# Patient Record
Sex: Female | Born: 1945
Health system: Southern US, Community
[De-identification: ages and names within clinical notes are randomized; demographics above are authoritative.]

## PROBLEM LIST (undated history)

## (undated) DIAGNOSIS — N951 Menopausal and female climacteric states: Secondary | ICD-10-CM

## (undated) DIAGNOSIS — I700 Atherosclerosis of aorta: Secondary | ICD-10-CM

## (undated) DIAGNOSIS — E890 Postprocedural hypothyroidism: Secondary | ICD-10-CM

## (undated) DIAGNOSIS — E785 Hyperlipidemia, unspecified: Secondary | ICD-10-CM

## (undated) DIAGNOSIS — N189 Chronic kidney disease, unspecified: Secondary | ICD-10-CM

## (undated) DIAGNOSIS — E7800 Pure hypercholesterolemia, unspecified: Secondary | ICD-10-CM

## (undated) DIAGNOSIS — E039 Hypothyroidism, unspecified: Secondary | ICD-10-CM

## (undated) DIAGNOSIS — I10 Essential (primary) hypertension: Secondary | ICD-10-CM

## (undated) DIAGNOSIS — Z905 Acquired absence of kidney: Secondary | ICD-10-CM

## (undated) DIAGNOSIS — K219 Gastro-esophageal reflux disease without esophagitis: Secondary | ICD-10-CM

## (undated) DIAGNOSIS — L570 Actinic keratosis: Secondary | ICD-10-CM

## (undated) DIAGNOSIS — M722 Plantar fascial fibromatosis: Secondary | ICD-10-CM

## (undated) DIAGNOSIS — F5101 Primary insomnia: Secondary | ICD-10-CM

## (undated) DIAGNOSIS — K635 Polyp of colon: Secondary | ICD-10-CM

## (undated) DIAGNOSIS — C44219 Basal cell carcinoma of skin of left ear and external auricular canal: Secondary | ICD-10-CM

## (undated) DIAGNOSIS — E119 Type 2 diabetes mellitus without complications: Secondary | ICD-10-CM

## (undated) DIAGNOSIS — I509 Heart failure, unspecified: Secondary | ICD-10-CM

## (undated) DIAGNOSIS — Z8673 Personal history of transient ischemic attack (TIA), and cerebral infarction without residual deficits: Secondary | ICD-10-CM

## (undated) DIAGNOSIS — Z9889 Other specified postprocedural states: Secondary | ICD-10-CM

## (undated) DIAGNOSIS — G459 Transient cerebral ischemic attack, unspecified: Secondary | ICD-10-CM

## (undated) DIAGNOSIS — I69398 Other sequelae of cerebral infarction: Secondary | ICD-10-CM

## (undated) DIAGNOSIS — R600 Localized edema: Secondary | ICD-10-CM

## (undated) DIAGNOSIS — E079 Disorder of thyroid, unspecified: Secondary | ICD-10-CM

## (undated) DIAGNOSIS — C801 Malignant (primary) neoplasm, unspecified: Secondary | ICD-10-CM

## (undated) HISTORY — DX: Hypothyroidism, unspecified: E03.9

## (undated) HISTORY — DX: Polyp of colon: K63.5

## (undated) HISTORY — PX: ABDOMINAL HYSTERECTOMY: SHX81

## (undated) HISTORY — DX: Atherosclerosis of aorta: I70.0

## (undated) HISTORY — PX: KIDNEY SURGERY: SHX687

## (undated) HISTORY — DX: Actinic keratosis: L57.0

## (undated) HISTORY — DX: Gastro-esophageal reflux disease without esophagitis: K21.9

## (undated) HISTORY — DX: Chronic kidney disease, unspecified: N18.9

## (undated) HISTORY — DX: Malignant (primary) neoplasm, unspecified: C80.1

## (undated) HISTORY — PX: CHOLECYSTECTOMY: SHX55

## (undated) HISTORY — DX: Menopausal and female climacteric states: N95.1

## (undated) HISTORY — DX: Acquired absence of kidney: Z90.5

## (undated) HISTORY — DX: Other specified postprocedural states: Z98.890

## (undated) HISTORY — DX: Disorder of thyroid, unspecified: E07.9

## (undated) HISTORY — DX: Essential (primary) hypertension: I10

## (undated) HISTORY — DX: Primary insomnia: F51.01

## (undated) HISTORY — DX: Localized edema: R60.0

## (undated) HISTORY — DX: Pure hypercholesterolemia, unspecified: E78.00

## (undated) HISTORY — DX: Type 2 diabetes mellitus without complications: E11.9

## (undated) HISTORY — DX: Postprocedural hypothyroidism: E89.0

## (undated) HISTORY — DX: Plantar fascial fibromatosis: M72.2

## (undated) HISTORY — PX: NECK SURGERY: SHX720

## (undated) HISTORY — PX: CATARACT EXTRACTION: SUR2

## (undated) HISTORY — DX: Personal history of transient ischemic attack (TIA), and cerebral infarction without residual deficits: Z86.73

## (undated) HISTORY — DX: Heart failure, unspecified: I50.9

## (undated) HISTORY — DX: Basal cell carcinoma of skin of left ear and external auricular canal: C44.219

## (undated) HISTORY — DX: Other sequelae of cerebral infarction: I69.398

## (undated) HISTORY — DX: Hyperlipidemia, unspecified: E78.5

## (undated) HISTORY — DX: Transient cerebral ischemic attack, unspecified: G45.9

## (undated) HISTORY — PX: ELBOW SURGERY: SHX618

---

## 1998-04-22 ENCOUNTER — Emergency Department (HOSPITAL_COMMUNITY): Admission: EM | Admit: 1998-04-22 | Discharge: 1998-04-22 | Payer: Self-pay | Admitting: Emergency Medicine

## 1998-05-08 ENCOUNTER — Ambulatory Visit (HOSPITAL_COMMUNITY): Admission: RE | Admit: 1998-05-08 | Discharge: 1998-05-08 | Payer: Self-pay | Admitting: Gastroenterology

## 1999-06-27 ENCOUNTER — Inpatient Hospital Stay (HOSPITAL_COMMUNITY): Admission: EM | Admit: 1999-06-27 | Discharge: 1999-07-02 | Payer: Self-pay | Admitting: Emergency Medicine

## 1999-06-27 ENCOUNTER — Encounter: Payer: Self-pay | Admitting: Emergency Medicine

## 1999-06-28 ENCOUNTER — Encounter: Payer: Self-pay | Admitting: Gastroenterology

## 1999-06-29 ENCOUNTER — Encounter: Payer: Self-pay | Admitting: Gastroenterology

## 1999-09-19 ENCOUNTER — Emergency Department (HOSPITAL_COMMUNITY): Admission: EM | Admit: 1999-09-19 | Discharge: 1999-09-19 | Payer: Self-pay | Admitting: Emergency Medicine

## 1999-09-20 ENCOUNTER — Encounter: Payer: Self-pay | Admitting: Emergency Medicine

## 1999-10-13 ENCOUNTER — Other Ambulatory Visit: Admission: RE | Admit: 1999-10-13 | Discharge: 1999-10-13 | Payer: Self-pay | Admitting: Obstetrics and Gynecology

## 2000-10-14 ENCOUNTER — Ambulatory Visit (HOSPITAL_COMMUNITY): Admission: RE | Admit: 2000-10-14 | Discharge: 2000-10-14 | Payer: Self-pay | Admitting: Family Medicine

## 2000-10-14 ENCOUNTER — Encounter: Payer: Self-pay | Admitting: Family Medicine

## 2000-11-26 ENCOUNTER — Other Ambulatory Visit: Admission: RE | Admit: 2000-11-26 | Discharge: 2000-11-26 | Payer: Self-pay | Admitting: Obstetrics and Gynecology

## 2001-08-07 ENCOUNTER — Encounter: Payer: Self-pay | Admitting: Family Medicine

## 2001-08-07 ENCOUNTER — Ambulatory Visit (HOSPITAL_COMMUNITY): Admission: RE | Admit: 2001-08-07 | Discharge: 2001-08-07 | Payer: Self-pay | Admitting: Family Medicine

## 2001-09-13 ENCOUNTER — Encounter: Payer: Self-pay | Admitting: Neurosurgery

## 2001-09-13 ENCOUNTER — Encounter: Admission: RE | Admit: 2001-09-13 | Discharge: 2001-09-13 | Payer: Self-pay | Admitting: Neurosurgery

## 2001-09-27 ENCOUNTER — Encounter: Payer: Self-pay | Admitting: Neurosurgery

## 2001-09-27 ENCOUNTER — Encounter: Admission: RE | Admit: 2001-09-27 | Discharge: 2001-09-27 | Payer: Self-pay | Admitting: Neurosurgery

## 2001-09-29 ENCOUNTER — Encounter: Admission: RE | Admit: 2001-09-29 | Discharge: 2001-09-29 | Payer: Self-pay | Admitting: Surgery

## 2001-09-29 ENCOUNTER — Encounter: Payer: Self-pay | Admitting: Surgery

## 2001-11-04 ENCOUNTER — Encounter: Admission: RE | Admit: 2001-11-04 | Discharge: 2001-11-04 | Payer: Self-pay | Admitting: Neurosurgery

## 2001-11-04 ENCOUNTER — Encounter: Payer: Self-pay | Admitting: Neurosurgery

## 2002-01-13 ENCOUNTER — Other Ambulatory Visit: Admission: RE | Admit: 2002-01-13 | Discharge: 2002-01-13 | Payer: Self-pay | Admitting: Obstetrics and Gynecology

## 2002-09-06 ENCOUNTER — Encounter: Payer: Self-pay | Admitting: Family Medicine

## 2002-09-06 ENCOUNTER — Encounter: Admission: RE | Admit: 2002-09-06 | Discharge: 2002-09-06 | Payer: Self-pay | Admitting: Family Medicine

## 2003-01-12 ENCOUNTER — Ambulatory Visit (HOSPITAL_COMMUNITY): Admission: RE | Admit: 2003-01-12 | Discharge: 2003-01-12 | Payer: Self-pay | Admitting: Obstetrics and Gynecology

## 2003-01-12 ENCOUNTER — Encounter: Payer: Self-pay | Admitting: Obstetrics and Gynecology

## 2003-02-05 ENCOUNTER — Other Ambulatory Visit: Admission: RE | Admit: 2003-02-05 | Discharge: 2003-02-05 | Payer: Self-pay | Admitting: Obstetrics and Gynecology

## 2004-05-06 ENCOUNTER — Other Ambulatory Visit: Admission: RE | Admit: 2004-05-06 | Discharge: 2004-05-06 | Payer: Self-pay | Admitting: Obstetrics and Gynecology

## 2004-08-03 ENCOUNTER — Ambulatory Visit (HOSPITAL_COMMUNITY): Admission: RE | Admit: 2004-08-03 | Discharge: 2004-08-03 | Payer: Self-pay | Admitting: Orthopaedic Surgery

## 2004-09-21 HISTORY — PX: FACELIFT: SHX1566

## 2004-11-07 ENCOUNTER — Ambulatory Visit (HOSPITAL_COMMUNITY): Admission: RE | Admit: 2004-11-07 | Discharge: 2004-11-07 | Payer: Self-pay | Admitting: Obstetrics and Gynecology

## 2005-06-18 ENCOUNTER — Other Ambulatory Visit: Admission: RE | Admit: 2005-06-18 | Discharge: 2005-06-18 | Payer: Self-pay | Admitting: Obstetrics and Gynecology

## 2005-09-22 ENCOUNTER — Emergency Department (HOSPITAL_COMMUNITY): Admission: EM | Admit: 2005-09-22 | Discharge: 2005-09-23 | Payer: Self-pay | Admitting: Emergency Medicine

## 2006-06-04 ENCOUNTER — Ambulatory Visit (HOSPITAL_COMMUNITY): Admission: RE | Admit: 2006-06-04 | Discharge: 2006-06-04 | Payer: Self-pay | Admitting: Family Medicine

## 2006-06-11 ENCOUNTER — Ambulatory Visit (HOSPITAL_COMMUNITY): Admission: RE | Admit: 2006-06-11 | Discharge: 2006-06-11 | Payer: Self-pay | Admitting: Family Medicine

## 2006-09-21 HISTORY — PX: ABDOMINOPLASTY: SUR9

## 2008-03-16 ENCOUNTER — Emergency Department (HOSPITAL_COMMUNITY): Admission: EM | Admit: 2008-03-16 | Discharge: 2008-03-16 | Payer: Self-pay | Admitting: Family Medicine

## 2009-02-14 ENCOUNTER — Encounter (HOSPITAL_COMMUNITY): Admission: RE | Admit: 2009-02-14 | Discharge: 2009-05-15 | Payer: Self-pay | Admitting: Internal Medicine

## 2010-07-24 ENCOUNTER — Ambulatory Visit: Payer: Self-pay | Admitting: Internal Medicine

## 2010-07-24 ENCOUNTER — Inpatient Hospital Stay (HOSPITAL_COMMUNITY): Admission: EM | Admit: 2010-07-24 | Discharge: 2010-07-26 | Payer: Self-pay | Admitting: Emergency Medicine

## 2010-07-25 ENCOUNTER — Encounter (INDEPENDENT_AMBULATORY_CARE_PROVIDER_SITE_OTHER): Payer: Self-pay | Admitting: Internal Medicine

## 2010-08-02 ENCOUNTER — Ambulatory Visit (HOSPITAL_COMMUNITY): Admission: RE | Admit: 2010-08-02 | Discharge: 2010-08-02 | Payer: Self-pay | Admitting: Urology

## 2010-09-08 ENCOUNTER — Inpatient Hospital Stay (HOSPITAL_COMMUNITY)
Admission: RE | Admit: 2010-09-08 | Discharge: 2010-09-11 | Payer: Self-pay | Source: Home / Self Care | Attending: Urology | Admitting: Urology

## 2010-09-08 ENCOUNTER — Encounter (INDEPENDENT_AMBULATORY_CARE_PROVIDER_SITE_OTHER): Payer: Self-pay | Admitting: Urology

## 2010-09-21 HISTORY — PX: HERNIA REPAIR: SHX51

## 2010-12-01 LAB — BASIC METABOLIC PANEL
BUN: 15 mg/dL (ref 6–23)
BUN: 21 mg/dL (ref 6–23)
BUN: 24 mg/dL — ABNORMAL HIGH (ref 6–23)
BUN: 25 mg/dL — ABNORMAL HIGH (ref 6–23)
CO2: 26 mEq/L (ref 19–32)
CO2: 28 mEq/L (ref 19–32)
CO2: 29 mEq/L (ref 19–32)
CO2: 29 mEq/L (ref 19–32)
CO2: 31 mEq/L (ref 19–32)
Calcium: 9 mg/dL (ref 8.4–10.5)
Calcium: 9.5 mg/dL (ref 8.4–10.5)
Chloride: 100 mEq/L (ref 96–112)
Chloride: 101 mEq/L (ref 96–112)
Chloride: 102 mEq/L (ref 96–112)
Chloride: 103 mEq/L (ref 96–112)
Chloride: 98 mEq/L (ref 96–112)
Creatinine, Ser: 1.22 mg/dL — ABNORMAL HIGH (ref 0.4–1.2)
Creatinine, Ser: 1.63 mg/dL — ABNORMAL HIGH (ref 0.4–1.2)
Creatinine, Ser: 1.8 mg/dL — ABNORMAL HIGH (ref 0.4–1.2)
Creatinine, Ser: 1.84 mg/dL — ABNORMAL HIGH (ref 0.4–1.2)
GFR calc Af Amer: 34 mL/min — ABNORMAL LOW (ref >60)
GFR calc Af Amer: 54 mL/min — ABNORMAL LOW (ref >60)
GFR calc non Af Amer: 29 mL/min — ABNORMAL LOW (ref >60)
Glucose, Bld: 122 mg/dL — ABNORMAL HIGH (ref 70–99)
Glucose, Bld: 133 mg/dL — ABNORMAL HIGH (ref 70–99)
Glucose, Bld: 157 mg/dL — ABNORMAL HIGH (ref 70–99)
Glucose, Bld: 94 mg/dL (ref 70–99)
Potassium: 3.1 mEq/L — ABNORMAL LOW (ref 3.5–5.1)
Potassium: 3.3 mEq/L — ABNORMAL LOW (ref 3.5–5.1)
Potassium: 3.9 mEq/L (ref 3.5–5.1)
Sodium: 138 mEq/L (ref 135–145)

## 2010-12-01 LAB — HEMOGLOBIN AND HEMATOCRIT, BLOOD: HCT: 39.4 % (ref 36.0–46.0)

## 2010-12-01 LAB — TYPE AND SCREEN
ABO/RH(D): O POS
Antibody Screen: NEGATIVE

## 2010-12-02 LAB — BASIC METABOLIC PANEL
BUN: 18 mg/dL (ref 6–23)
CO2: 28 mEq/L (ref 19–32)
Glucose, Bld: 96 mg/dL (ref 70–99)
Potassium: 3.3 mEq/L — ABNORMAL LOW (ref 3.5–5.1)
Sodium: 138 mEq/L (ref 135–145)

## 2010-12-02 LAB — CBC
HCT: 39.6 % (ref 36.0–46.0)
Hemoglobin: 13.4 g/dL (ref 12.0–15.0)
MCH: 29.2 pg (ref 26.0–34.0)
MCHC: 33.8 g/dL (ref 30.0–36.0)
RDW: 13.2 % (ref 11.5–15.5)

## 2010-12-03 LAB — CARDIAC PANEL(CRET KIN+CKTOT+MB+TROPI)
Relative Index: INVALID (ref 0.0–2.5)
Total CK: 99 U/L (ref 7–177)
Troponin I: <0.01 ng/mL (ref 0.00–0.06)
Troponin I: <0.01 ng/mL (ref 0.00–0.06)

## 2010-12-03 LAB — CBC
HCT: 33.7 % — ABNORMAL LOW (ref 36.0–46.0)
Hemoglobin: 11.6 g/dL — ABNORMAL LOW (ref 12.0–15.0)
MCH: 28.4 pg (ref 26.0–34.0)
MCH: 29.3 pg (ref 26.0–34.0)
MCHC: 33 g/dL (ref 30.0–36.0)
MCHC: 34.4 g/dL (ref 30.0–36.0)
MCV: 85.1 fL (ref 78.0–100.0)
MCV: 85.8 fL (ref 78.0–100.0)
Platelets: 158 10*3/uL (ref 150–400)
RBC: 3.96 MIL/uL (ref 3.87–5.11)
RDW: 13.3 % (ref 11.5–15.5)

## 2010-12-03 LAB — COMPREHENSIVE METABOLIC PANEL
ALT: 31 U/L (ref 0–35)
AST: 37 U/L (ref 0–37)
Alkaline Phosphatase: 86 U/L (ref 39–117)
CO2: 28 mEq/L (ref 19–32)
Calcium: 8.5 mg/dL (ref 8.4–10.5)
Chloride: 109 mEq/L (ref 96–112)
GFR calc Af Amer: 58 mL/min — ABNORMAL LOW (ref >60)
GFR calc non Af Amer: 48 mL/min — ABNORMAL LOW (ref >60)
Glucose, Bld: 122 mg/dL — ABNORMAL HIGH (ref 70–99)
Potassium: 3.4 mEq/L — ABNORMAL LOW (ref 3.5–5.1)
Sodium: 142 mEq/L (ref 135–145)
Total Bilirubin: 0.4 mg/dL (ref 0.3–1.2)

## 2010-12-03 LAB — CULTURE, BLOOD (ROUTINE X 2)
Culture  Setup Time: 201111030816
Culture: NO GROWTH

## 2010-12-03 LAB — STREP PNEUMONIAE URINARY ANTIGEN: Strep Pneumo Urinary Antigen: NEGATIVE

## 2010-12-03 LAB — D-DIMER, QUANTITATIVE: D-Dimer, Quant: 0.57 ug/mL-FEU — ABNORMAL HIGH (ref 0.00–0.48)

## 2010-12-03 LAB — BASIC METABOLIC PANEL
Calcium: 9.3 mg/dL (ref 8.4–10.5)
GFR calc non Af Amer: 43 mL/min — ABNORMAL LOW (ref >60)
Glucose, Bld: 92 mg/dL (ref 70–99)
Potassium: 3.7 mEq/L (ref 3.5–5.1)
Sodium: 141 mEq/L (ref 135–145)

## 2010-12-03 LAB — DIFFERENTIAL
Eosinophils Relative: 1 % (ref 0–5)
Lymphocytes Relative: 15 % (ref 12–46)
Lymphs Abs: 1.1 10*3/uL (ref 0.7–4.0)
Neutro Abs: 5.3 10*3/uL (ref 1.7–7.7)

## 2010-12-03 LAB — LEGIONELLA ANTIGEN, URINE: Legionella Antigen, Urine: NEGATIVE

## 2010-12-03 LAB — TSH: TSH: 0.116 u[IU]/mL — ABNORMAL LOW (ref 0.350–4.500)

## 2010-12-03 LAB — TROPONIN I: Troponin I: <0.01 ng/mL (ref 0.00–0.06)

## 2011-03-13 ENCOUNTER — Other Ambulatory Visit (HOSPITAL_COMMUNITY): Payer: Self-pay | Admitting: Urology

## 2011-03-13 ENCOUNTER — Ambulatory Visit (HOSPITAL_COMMUNITY)
Admission: RE | Admit: 2011-03-13 | Discharge: 2011-03-13 | Disposition: A | Discharge status: Home / Self Care | Payer: BC Managed Care – PPO | Source: Ambulatory Visit | Attending: Urology | Admitting: Urology

## 2011-03-13 DIAGNOSIS — Z87891 Personal history of nicotine dependence: Secondary | ICD-10-CM | POA: Insufficient documentation

## 2011-03-13 DIAGNOSIS — Z8701 Personal history of pneumonia (recurrent): Secondary | ICD-10-CM | POA: Insufficient documentation

## 2011-03-13 DIAGNOSIS — I10 Essential (primary) hypertension: Secondary | ICD-10-CM | POA: Insufficient documentation

## 2011-03-13 DIAGNOSIS — Z79899 Other long term (current) drug therapy: Secondary | ICD-10-CM | POA: Insufficient documentation

## 2011-03-13 DIAGNOSIS — Z905 Acquired absence of kidney: Secondary | ICD-10-CM | POA: Insufficient documentation

## 2011-03-13 DIAGNOSIS — C649 Malignant neoplasm of unspecified kidney, except renal pelvis: Secondary | ICD-10-CM | POA: Insufficient documentation

## 2011-06-05 ENCOUNTER — Encounter (INDEPENDENT_AMBULATORY_CARE_PROVIDER_SITE_OTHER): Payer: Self-pay | Admitting: Surgery

## 2011-06-09 ENCOUNTER — Encounter (INDEPENDENT_AMBULATORY_CARE_PROVIDER_SITE_OTHER): Payer: Self-pay | Admitting: Surgery

## 2011-06-10 ENCOUNTER — Encounter (INDEPENDENT_AMBULATORY_CARE_PROVIDER_SITE_OTHER): Payer: Self-pay | Admitting: Surgery

## 2011-06-15 ENCOUNTER — Encounter (INDEPENDENT_AMBULATORY_CARE_PROVIDER_SITE_OTHER): Payer: Self-pay | Admitting: Surgery

## 2011-06-17 ENCOUNTER — Ambulatory Visit (INDEPENDENT_AMBULATORY_CARE_PROVIDER_SITE_OTHER): Payer: BC Managed Care – PPO | Admitting: Surgery

## 2011-06-17 ENCOUNTER — Encounter (INDEPENDENT_AMBULATORY_CARE_PROVIDER_SITE_OTHER): Payer: Self-pay | Admitting: Surgery

## 2011-06-17 VITALS — BP 138/72 | HR 72 | Temp 97.2°F | Resp 12 | Ht 62.0 in | Wt 170.4 lb

## 2011-06-17 DIAGNOSIS — K439 Ventral hernia without obstruction or gangrene: Secondary | ICD-10-CM

## 2011-06-17 NOTE — Patient Instructions (Signed)
We will contact Dr. Laverle Patter about your CT scan.  After the scan is complete, then we will contact you to discuss the hernia surgery.

## 2011-06-17 NOTE — Progress Notes (Signed)
Chief Complaint  Patient presents with  . Incisional Hernia    HPI Julia Perkins is a 65 y.o. female referred by Dr. Heloise Purpura for evaluation of a ventral hernia. This patient is status post laparoscopic left radical nephrectomy on September 08, 2010. She had a small periumbilical midline incision to extract the kidney. Several months ago she noticed a bulge in this area. This bulge has become fairly large and uncomfortable. It is also fairly unsightly. She was evaluated by Dr. Vevelyn Royals PA who felt that this represented an incisional hernia. She is now referred for surgical evaluation. The patient states that she is scheduled for a followup CT scan by Dr. Laverle Patter in December. She denies any obstructive symptoms but does report a full sensation when she eats. HPI  Past Medical History  Diagnosis Date  . Chronic kidney disease   . GERD (gastroesophageal reflux disease)   . Hypertension   . Thyroid disease   . Hyperlipidemia   . S/P abdominoplasty   . Cancer     renal cell carcinoma    Past Surgical History  Procedure Date  . Cesarean section   . Cholecystectomy   . Abdominal hysterectomy   . Elbow surgery 90?    left  . Kidney surgery   . Neck surgery 80's  . Facelift 2006  . Abdominoplasty 2008    Family History  Problem Relation Age of Onset  . Cancer Brother     kidney    Social History History  Substance Use Topics  . Smoking status: Former Games developer  . Smokeless tobacco: Not on file  . Alcohol Use: No    No Known Allergies  Current Outpatient Prescriptions  Medication Sig Dispense Refill  . ALPRAZolam (XANAX) 0.25 MG tablet Take 0.25 mg by mouth at bedtime as needed.        Marland Kitchen aspirin 81 MG tablet Take 81 mg by mouth daily.        Marland Kitchen atenolol (TENORMIN) 50 MG tablet Take 50 mg by mouth daily.        . fish oil-omega-3 fatty acids 1000 MG capsule Take 2 g by mouth daily.        . hydrochlorothiazide (HYDRODIURIL) 25 MG tablet daily.      . Levothyroxine  Sodium 100 MCG CAPS Take by mouth.        . rosuvastatin (CRESTOR) 5 MG tablet Take 5 mg by mouth daily.          Review of Systems Review of Systems ROS positive for headaches, abdominal swelling and discomfort Blood pressure 138/72, pulse 72, temperature 97.2 F (36.2 C), temperature source Temporal, resp. rate 12, height 5\' 2"  (1.575 m), weight 170 lb 6.4 oz (77.293 kg).  Physical Exam Physical Exam WDWN in NAD HEENT:  EOMI, sclera anicteric Neck:  No masses, no thyromegaly Lungs:  CTA bilaterally; normal respiratory effort CV:  Regular rate and rhythm; no murmurs Abd:  +bowel sounds, soft, non-tender Healed right transverse incision at the level of the umbilicus.  Healed low transverse abdominoplasty incision.  Recently healed midline incision around the umbilicus. The patient has a 10 cm partially reducible ventral hernia at the periumbilical region.   Ext:  Well-perfused; no edema Skin:  Warm, dry; no sign of jaundice  Data Reviewed None  Assessment    Periumbilical ventral incisional hernia    Plan    I will speak with Dr. Laverle Patter.  If he needs to get a surveillance CT scan, we will consider  getting this sooner before planning the ventral hernia repair.  His office will schedule the scan, then we will see her back.       Cedra Villalon K. 06/17/2011, 4:34 PM

## 2011-06-24 ENCOUNTER — Encounter (INDEPENDENT_AMBULATORY_CARE_PROVIDER_SITE_OTHER): Payer: Self-pay | Admitting: Surgery

## 2011-06-26 ENCOUNTER — Ambulatory Visit (INDEPENDENT_AMBULATORY_CARE_PROVIDER_SITE_OTHER): Payer: BC Managed Care – PPO | Admitting: Surgery

## 2011-06-26 ENCOUNTER — Encounter (INDEPENDENT_AMBULATORY_CARE_PROVIDER_SITE_OTHER): Payer: Self-pay | Admitting: Surgery

## 2011-06-26 VITALS — BP 138/84 | HR 68 | Temp 97.0°F | Resp 16 | Ht 62.0 in | Wt 171.0 lb

## 2011-06-26 DIAGNOSIS — K439 Ventral hernia without obstruction or gangrene: Secondary | ICD-10-CM

## 2011-06-26 NOTE — Progress Notes (Signed)
Chief Complaint  Patient presents with  . Follow-up    go over CT scan    HPI Julia Perkins is a 65 y.o. female referred by Dr. Heloise Purpura for evaluation of a ventral hernia. This patient is status post laparoscopic left radical nephrectomy on September 08, 2010. She had a small periumbilical midline incision to extract the kidney. Several months ago she noticed a bulge in this area. This bulge has become fairly large and uncomfortable. It is also fairly unsightly. She was evaluated by Dr. Vevelyn Royals PA who felt that this represented an incisional hernia. She is now referred for surgical evaluation. The patient states that she is scheduled for a followup CT scan by Dr. Laverle Patter in December. She denies any obstructive symptoms but does report a full sensation when she eats. HPI  Past Medical History  Diagnosis Date  . Chronic kidney disease   . GERD (gastroesophageal reflux disease)   . Hypertension   . Thyroid disease   . Hyperlipidemia   . S/P abdominoplasty   . Cancer     renal cell carcinoma    Past Surgical History  Procedure Date  . Cesarean section   . Cholecystectomy   . Abdominal hysterectomy   . Elbow surgery 90?    left  . Kidney surgery   . Neck surgery 80's  . Facelift 2006  . Abdominoplasty 2008    Family History  Problem Relation Age of Onset  . Cancer Brother     kidney    Social History History  Substance Use Topics  . Smoking status: Former Games developer  . Smokeless tobacco: Not on file  . Alcohol Use: No    No Known Allergies  Current Outpatient Prescriptions  Medication Sig Dispense Refill  . ALPRAZolam (XANAX) 0.25 MG tablet Take 0.25 mg by mouth at bedtime as needed.        Marland Kitchen aspirin 81 MG tablet Take 81 mg by mouth daily.        Marland Kitchen atenolol (TENORMIN) 50 MG tablet Take 50 mg by mouth daily.        . fish oil-omega-3 fatty acids 1000 MG capsule Take 2 g by mouth daily.        . hydrochlorothiazide (HYDRODIURIL) 25 MG tablet daily.      .  Levothyroxine Sodium 100 MCG CAPS Take by mouth.        . rosuvastatin (CRESTOR) 5 MG tablet Take 5 mg by mouth daily.          Review of Systems Review of Systems ROS positive for headaches, abdominal swelling and discomfort Blood pressure 138/84, pulse 68, temperature 97 F (36.1 C), temperature source Temporal, resp. rate 16, height 5\' 2"  (1.575 m), weight 171 lb (77.565 kg).  Physical Exam Physical Exam WDWN in NAD HEENT:  EOMI, sclera anicteric Neck:  No masses, no thyromegaly Lungs:  CTA bilaterally; normal respiratory effort CV:  Regular rate and rhythm; no murmurs Abd:  +bowel sounds, soft, non-tender Healed right transverse incision at the level of the umbilicus.  Healed low transverse abdominoplasty incision.  Recently healed midline incision around the umbilicus. The patient has a 10 cm partially reducible ventral hernia at the periumbilical region.   Ext:  Well-perfused; no edema Skin:  Warm, dry; no sign of jaundice  Data Reviewed None  Assessment    Periumbilical ventral incisional hernia    Plan    I will speak with Dr. Laverle Patter.  If he needs to get a surveillance CT  scan, we will consider getting this sooner before planning the ventral hernia repair.  His office will schedule the scan, then we will see her back.     The patient returns after her CT scan. This showed no abnormal findings related to her previous nephrectomy. The hernia defect measures 2.6 x 2.6 cm. The hernia sac is about 6 cm.  We discussed options for repair of her ventral hernia. We discussed laparoscopic versus open techniques. The patient understands that a laparoscopic approach would still leave the hernia defect and place but would cover with a piece of mesh. She prefers to have the muscle closed so we will proceed with an open technique.  The surgical procedure has been discussed with the patient.  Potential risks, benefits, alternative treatments, and expected outcomes have been explained.   All of the patient's questions at this time have been answered.  The patient understand the proposed surgical procedure and wishes to proceed.  Micheal Sheen K. 06/26/2011, 9:24 AM

## 2011-06-26 NOTE — Patient Instructions (Signed)
Hold your aspirin and fish oil for five days before surgery.

## 2011-07-08 ENCOUNTER — Other Ambulatory Visit (INDEPENDENT_AMBULATORY_CARE_PROVIDER_SITE_OTHER): Payer: Self-pay | Admitting: Surgery

## 2011-07-08 ENCOUNTER — Encounter (HOSPITAL_COMMUNITY): Payer: BC Managed Care – PPO

## 2011-07-08 LAB — CBC
HCT: 37.3 % (ref 36.0–46.0)
Hemoglobin: 12.5 g/dL (ref 12.0–15.0)
MCHC: 33.5 g/dL (ref 30.0–36.0)
RDW: 13.2 % (ref 11.5–15.5)
WBC: 6.7 10*3/uL (ref 4.0–10.5)

## 2011-07-08 LAB — BASIC METABOLIC PANEL
BUN: 24 mg/dL — ABNORMAL HIGH (ref 6–23)
Chloride: 101 mEq/L (ref 96–112)
GFR calc Af Amer: 40 mL/min — ABNORMAL LOW (ref >90)
GFR calc non Af Amer: 35 mL/min — ABNORMAL LOW (ref >90)
Potassium: 3.4 mEq/L — ABNORMAL LOW (ref 3.5–5.1)
Sodium: 140 mEq/L (ref 135–145)

## 2011-07-08 LAB — SURGICAL PCR SCREEN: Staphylococcus aureus: NEGATIVE

## 2011-07-10 ENCOUNTER — Ambulatory Visit (HOSPITAL_COMMUNITY)
Admission: RE | Admit: 2011-07-10 | Discharge: 2011-07-11 | Disposition: A | Discharge status: Home / Self Care | Payer: BC Managed Care – PPO | Source: Ambulatory Visit | Attending: Surgery | Admitting: Surgery

## 2011-07-10 DIAGNOSIS — K439 Ventral hernia without obstruction or gangrene: Secondary | ICD-10-CM

## 2011-07-10 DIAGNOSIS — K432 Incisional hernia without obstruction or gangrene: Secondary | ICD-10-CM | POA: Insufficient documentation

## 2011-07-10 DIAGNOSIS — Z905 Acquired absence of kidney: Secondary | ICD-10-CM | POA: Insufficient documentation

## 2011-07-10 DIAGNOSIS — I10 Essential (primary) hypertension: Secondary | ICD-10-CM | POA: Insufficient documentation

## 2011-07-10 DIAGNOSIS — Z01812 Encounter for preprocedural laboratory examination: Secondary | ICD-10-CM | POA: Insufficient documentation

## 2011-07-11 NOTE — Op Note (Signed)
  NAMEBEATA, BEASON             ACCOUNT NO.:  [id number]  MEDICAL RECORD NO.:  [id number]  LOCATION:  DAYL                         FACILITY:  [facility]  PHYSICIAN:  Wilmon Arms. Corliss Skains, M.D. DATE OF BIRTH:  [birth year]  DATE OF PROCEDURE:  07/10/2011 DATE OF DISCHARGE:                              OPERATIVE REPORT   PREOPERATIVE DIAGNOSIS:  Ventral hernia.  POSTOPERATIVE DIAGNOSIS:  Ventral hernia.  PROCEDURE PERFORMED:  Open ventral hernia repair with mesh.  SURGEON:  Wilmon Arms. Kavontae Pritchard, M.D.  ANESTHESIA:  General.  INDICATIONS:  This is a 65 year old female who is status post left radical nephrectomy in 2011.  She had an extraction site near her umbilicus.  She has developed a hernia at this site.  A CT scan showed a 2.6 cm fascial defect, but no other abdominal wall defects.  She presents now for repair.  DESCRIPTION OF PROCEDURE:  The patient was brought to the operating room, placed in the supine position on operating table.  After adequate level of general anesthesia was obtained, a Foley catheter was placed under sterile technique.  The patient's abdomen was prepped with ChloraPrep and draped in sterile fashion.  She has a previous midline incision, as well as a reimplanted umbilicus.  We opened her midline incision and went around to the right of her umbilicus.  Dissection was carried down the subcutaneous tissues with cautery.  We dissected down the hernia sac.  We dissected the hernia sac all way down to the fascia. We reduced the hernia sac back in the preperitoneal space.  We cleared the fascia anterior and posteriorly.  The fascial defect seemed to be just above the base of the umbilicus __________ fascia.  We measured the defect at about 2.5 cm.  We used a 6.4 cm piece of Proceed ventral patch in the preperitoneal space.  We secured this with 8 interrupted transfascial sutures of 0 Novafil.  The fascia was reapproximated with figure-of-eight #1 Novafil sutures.  The  subcutaneous tissues were irrigated and infiltrated with Exparel 20 mL.  The subcutaneous tissue was closed with 3-0 Vicryl.  A 4-0 Monocryl was used to close the skin incision.  Benzoin and Steri-Strips and a clean dressing were applied. The patient was extubated and brought to recovery room in stable condition.  All sponge, instrument, and needle counts were correct.     Wilmon Arms. Corliss Skains, M.D.     MKT/MEDQ  D:  07/10/2011  T:  07/10/2011  Job:  [id number]  Electronically Signed by Manus Rudd M.D. on 07/11/2011 03:29:45 PM

## 2011-07-17 ENCOUNTER — Encounter (INDEPENDENT_AMBULATORY_CARE_PROVIDER_SITE_OTHER): Payer: Self-pay | Admitting: Surgery

## 2011-07-22 NOTE — Discharge Summary (Signed)
  [name], Julia Perkins             ACCOUNT NO.:  [id number]  MEDICAL RECORD NO.:  [id number]  LOCATION:  1536                         FACILITY:  [facility]  PHYSICIAN:  Wilmon Arms. Corliss Skains, M.D. DATE OF BIRTH:  1946-07-11  DATE OF ADMISSION:  07/10/2011 DATE OF DISCHARGE:  07/11/2011                              DISCHARGE SUMMARY   ADMISSION DIAGNOSIS:  Ventral hernia.  DISCHARGE DIAGNOSIS:  Ventral hernia.  PROCEDURE:  Open ventral hernia repair with mesh.  BRIEF HISTORY:  This is a 65 year old female, who underwent a laparoscopic left radical nephrectomy.  She developed hernia near her umbilicus.  HOSPITAL COURSE:  The patient was admitted to hospital on October 19, where she underwent open ventral hernia repair with mesh.  We repaired this with a 6.4 cm Proceed ventral patch.  The patient was kept overnight for pain control.  She was discharged home on postop day #1, after her postop nausea resolved.  DISCHARGE INSTRUCTIONS:  She is given Percocet p.r.n. for pain.  Follow up in 2 to 3 weeks with Dr. Corliss Skains.  No heavy lifting.  She may shower over her wound.     Wilmon Arms. Corliss Skains, M.D.     MKT/MEDQ  D:  07/21/2011  T:  07/21/2011  Job:  [id number]  Electronically Signed by Manus Rudd M.D. on 07/22/2011 01:49:49 PM

## 2011-07-29 ENCOUNTER — Ambulatory Visit (INDEPENDENT_AMBULATORY_CARE_PROVIDER_SITE_OTHER): Payer: BC Managed Care – PPO | Admitting: Surgery

## 2011-07-29 ENCOUNTER — Encounter (INDEPENDENT_AMBULATORY_CARE_PROVIDER_SITE_OTHER): Payer: Self-pay | Admitting: Surgery

## 2011-07-29 VITALS — BP 142/88 | HR 64 | Temp 97.6°F | Resp 14 | Ht 62.0 in | Wt 170.8 lb

## 2011-07-29 DIAGNOSIS — K439 Ventral hernia without obstruction or gangrene: Secondary | ICD-10-CM

## 2011-07-29 NOTE — Progress Notes (Signed)
Status post open ventral hernia repair with mesh on October 19. The patient is doing quite well. She reports virtually no pain. Her incision is well-healed no sign of infection. No sign of recurrent hernia. No seroma or hematoma. She should continue limiting her activity for another 2 weeks but may resume full activity. Followup p.r.n.

## 2011-07-29 NOTE — Patient Instructions (Signed)
Limit your physical activity for the next couple of weeks, then you may resume full activity.  You may wash over the incision normally.  Follow-up as needed

## 2011-09-02 ENCOUNTER — Encounter (INDEPENDENT_AMBULATORY_CARE_PROVIDER_SITE_OTHER): Payer: Self-pay | Admitting: Surgery

## 2011-09-08 ENCOUNTER — Ambulatory Visit (HOSPITAL_COMMUNITY)
Admission: RE | Admit: 2011-09-08 | Discharge: 2011-09-08 | Disposition: A | Discharge status: Home / Self Care | Payer: BC Managed Care – PPO | Source: Ambulatory Visit | Attending: Urology | Admitting: Urology

## 2011-09-08 ENCOUNTER — Other Ambulatory Visit (HOSPITAL_COMMUNITY): Payer: Self-pay | Admitting: Urology

## 2011-09-08 DIAGNOSIS — C649 Malignant neoplasm of unspecified kidney, except renal pelvis: Secondary | ICD-10-CM

## 2012-03-25 ENCOUNTER — Other Ambulatory Visit: Payer: Self-pay | Admitting: Urology

## 2012-03-25 ENCOUNTER — Ambulatory Visit (HOSPITAL_COMMUNITY)
Admission: RE | Admit: 2012-03-25 | Discharge: 2012-03-25 | Disposition: A | Discharge status: Home / Self Care | Payer: BC Managed Care – PPO | Source: Ambulatory Visit | Attending: Urology | Admitting: Urology

## 2012-03-25 DIAGNOSIS — C649 Malignant neoplasm of unspecified kidney, except renal pelvis: Secondary | ICD-10-CM

## 2012-03-25 DIAGNOSIS — M47814 Spondylosis without myelopathy or radiculopathy, thoracic region: Secondary | ICD-10-CM | POA: Insufficient documentation

## 2012-10-05 ENCOUNTER — Ambulatory Visit (HOSPITAL_COMMUNITY)
Admission: RE | Admit: 2012-10-05 | Discharge: 2012-10-05 | Disposition: A | Discharge status: Home / Self Care | Payer: Medicare Other | Source: Ambulatory Visit | Attending: Urology | Admitting: Urology

## 2012-10-05 ENCOUNTER — Other Ambulatory Visit (HOSPITAL_COMMUNITY): Payer: Self-pay | Admitting: Urology

## 2012-10-05 DIAGNOSIS — Z8542 Personal history of malignant neoplasm of other parts of uterus: Secondary | ICD-10-CM | POA: Insufficient documentation

## 2012-10-05 DIAGNOSIS — C649 Malignant neoplasm of unspecified kidney, except renal pelvis: Secondary | ICD-10-CM

## 2013-04-12 ENCOUNTER — Other Ambulatory Visit: Payer: Self-pay | Admitting: Geriatric Medicine

## 2013-04-12 DIAGNOSIS — R519 Headache, unspecified: Secondary | ICD-10-CM

## 2013-04-22 ENCOUNTER — Ambulatory Visit
Admission: RE | Admit: 2013-04-22 | Discharge: 2013-04-22 | Disposition: A | Discharge status: Home / Self Care | Payer: Medicare Other | Source: Ambulatory Visit | Attending: Geriatric Medicine | Admitting: Geriatric Medicine

## 2013-04-22 DIAGNOSIS — R519 Headache, unspecified: Secondary | ICD-10-CM

## 2013-04-22 MED ORDER — GADOBENATE DIMEGLUMINE 529 MG/ML IV SOLN
8.0000 mL | Freq: Once | INTRAVENOUS | Status: AC | PRN
  Administered 2013-04-22: 8 mL via INTRAVENOUS

## 2013-07-06 ENCOUNTER — Ambulatory Visit (INDEPENDENT_AMBULATORY_CARE_PROVIDER_SITE_OTHER): Payer: Medicare Other | Admitting: Podiatry

## 2013-07-06 ENCOUNTER — Encounter: Payer: Self-pay | Admitting: Podiatry

## 2013-07-06 VITALS — BP 156/80 | HR 99 | Resp 16

## 2013-07-06 DIAGNOSIS — M722 Plantar fascial fibromatosis: Secondary | ICD-10-CM

## 2013-07-06 MED ORDER — MELOXICAM 15 MG PO TABS: 15.0000 mg | ORAL_TABLET | Freq: Every day | ORAL | Status: DC

## 2013-07-06 MED ORDER — TRIAMCINOLONE ACETONIDE 10 MG/ML IJ SUSP
5.0000 mg | Freq: Once | INTRAMUSCULAR | Status: AC
  Administered 2013-07-06: 5 mg via INTRA_ARTICULAR

## 2013-07-06 NOTE — Progress Notes (Signed)
Subjective:     Patient ID: Julia Perkins, female   DOB: 12-10-1945, 67 y.o.   MRN: [id number]  Foot Pain   patient states my right heel has not gotten better and it is still very tender. Worse when ambulating   Review of Systems  All other systems reviewed and are negative.       Objective:   Physical Exam  Nursing note and vitals reviewed. Constitutional: She appears well-developed and well-nourished.  Cardiovascular: Intact distal pulses.   Musculoskeletal: Normal range of motion.  Neurological: She is alert.  Skin: Skin is warm.   Patient's right heel is very tender in the medial calcaneal tubercle at the insertion of the plantar fascia no edema noted    Assessment:     Plantar fasciitis of an acute nature which failed to respond to previous visit and treatment    Plan:     Instructed on importance of physical therapy and ice application. Due to intense discomfort I did apply an air fracture walker to reduce stress on the heel and reinjected 3 mg Kenalog 5 mg Xylocaine Marcaine mixture. Reappoint 3 weeks and did discuss shockwave there

## 2013-07-06 NOTE — Patient Instructions (Signed)
Wear your boot as much as possible for 2 weeks and reduce the 3rd week

## 2013-07-27 ENCOUNTER — Ambulatory Visit (INDEPENDENT_AMBULATORY_CARE_PROVIDER_SITE_OTHER): Payer: Medicare Other | Admitting: Podiatry

## 2013-07-27 VITALS — BP 153/67 | HR 60 | Resp 17 | Ht 62.0 in | Wt 172.0 lb

## 2013-07-27 DIAGNOSIS — M722 Plantar fascial fibromatosis: Secondary | ICD-10-CM

## 2013-07-27 NOTE — Progress Notes (Signed)
Subjective:     Patient ID: Julia Perkins, female   DOB: October 22, 1945, 67 y.o.   MRN: [id number]  HPI patient states my heel is feeling quite a bit better. Occasional bouts of pain but mostly improved   Review of Systems     Objective:   Physical Exam  Nursing note and vitals reviewed. Constitutional: She is oriented to person, place, and time.  Cardiovascular: Intact distal pulses.   Musculoskeletal: Normal range of motion.  Neurological: She is oriented to person, place, and time.  Skin: Skin is warm.   I noted the discomfort to be reduced by about 75% on the plantar right heel     Assessment:     Improving plantar fasciitis right    Plan:     Gave instructions on physical therapy and the wearing of supportive shoe gear. Advised on not going barefoot for the time being and continuing night splint reappoint as needed

## 2013-08-14 ENCOUNTER — Ambulatory Visit (INDEPENDENT_AMBULATORY_CARE_PROVIDER_SITE_OTHER): Payer: Medicare Other | Admitting: Podiatry

## 2013-08-14 ENCOUNTER — Encounter: Payer: Self-pay | Admitting: Podiatry

## 2013-08-14 VITALS — BP 148/70 | HR 66 | Resp 16

## 2013-08-14 DIAGNOSIS — M722 Plantar fascial fibromatosis: Secondary | ICD-10-CM

## 2013-08-14 NOTE — Progress Notes (Signed)
Subjective:     Patient ID: Julia Perkins, female   DOB: [birth year], 67 y.o.   MRN: [id number]  HPI patient states my heels are feeling pretty good just get sore at the end of the day   Review of Systems     Objective:   Physical Exam Neurovascular status intact with patient well oriented. Heel pain noted of a mild nature both feet    Assessment:     Plantar fasciitis improving both heels gradually over time    Plan:     Dispensed orthotics and reviewed instructions on physical therapy and other conservative modalities. Discharge less need

## 2013-08-14 NOTE — Patient Instructions (Signed)

## 2013-11-01 ENCOUNTER — Ambulatory Visit (HOSPITAL_COMMUNITY)
Admission: RE | Admit: 2013-11-01 | Discharge: 2013-11-01 | Disposition: A | Discharge status: Home / Self Care | Payer: Medicare Other | Source: Ambulatory Visit | Attending: Urology | Admitting: Urology

## 2013-11-01 ENCOUNTER — Other Ambulatory Visit (HOSPITAL_COMMUNITY): Payer: Self-pay | Admitting: Urology

## 2013-11-01 DIAGNOSIS — C649 Malignant neoplasm of unspecified kidney, except renal pelvis: Secondary | ICD-10-CM

## 2013-11-01 DIAGNOSIS — J984 Other disorders of lung: Secondary | ICD-10-CM | POA: Insufficient documentation

## 2013-11-01 DIAGNOSIS — Z85528 Personal history of other malignant neoplasm of kidney: Secondary | ICD-10-CM | POA: Insufficient documentation

## 2014-01-19 ENCOUNTER — Other Ambulatory Visit (HOSPITAL_COMMUNITY): Payer: Self-pay | Admitting: Geriatric Medicine

## 2014-01-19 DIAGNOSIS — Z1231 Encounter for screening mammogram for malignant neoplasm of breast: Secondary | ICD-10-CM

## 2014-02-01 ENCOUNTER — Ambulatory Visit (HOSPITAL_COMMUNITY)
Admission: RE | Admit: 2014-02-01 | Discharge: 2014-02-01 | Disposition: A | Discharge status: Home / Self Care | Payer: Medicare Other | Source: Ambulatory Visit | Attending: Geriatric Medicine | Admitting: Geriatric Medicine

## 2014-02-01 DIAGNOSIS — Z1231 Encounter for screening mammogram for malignant neoplasm of breast: Secondary | ICD-10-CM

## 2014-02-05 ENCOUNTER — Other Ambulatory Visit: Payer: Self-pay | Admitting: Geriatric Medicine

## 2014-02-05 DIAGNOSIS — R928 Other abnormal and inconclusive findings on diagnostic imaging of breast: Secondary | ICD-10-CM

## 2014-02-09 ENCOUNTER — Ambulatory Visit
Admission: RE | Admit: 2014-02-09 | Discharge: 2014-02-09 | Disposition: A | Discharge status: Home / Self Care | Payer: Medicare Other | Source: Ambulatory Visit | Attending: Geriatric Medicine | Admitting: Geriatric Medicine

## 2014-02-09 ENCOUNTER — Other Ambulatory Visit: Payer: Self-pay | Admitting: Geriatric Medicine

## 2014-02-09 DIAGNOSIS — R928 Other abnormal and inconclusive findings on diagnostic imaging of breast: Secondary | ICD-10-CM

## 2014-03-09 ENCOUNTER — Ambulatory Visit
Admission: RE | Admit: 2014-03-09 | Discharge: 2014-03-09 | Disposition: A | Discharge status: Home / Self Care | Payer: Medicare Other | Source: Ambulatory Visit | Attending: Geriatric Medicine | Admitting: Geriatric Medicine

## 2014-03-09 ENCOUNTER — Other Ambulatory Visit: Payer: Self-pay | Admitting: Geriatric Medicine

## 2014-03-09 ENCOUNTER — Encounter (INDEPENDENT_AMBULATORY_CARE_PROVIDER_SITE_OTHER): Payer: Self-pay

## 2014-03-09 DIAGNOSIS — R928 Other abnormal and inconclusive findings on diagnostic imaging of breast: Secondary | ICD-10-CM

## 2014-04-18 ENCOUNTER — Encounter: Payer: Self-pay | Admitting: *Deleted

## 2014-10-29 ENCOUNTER — Ambulatory Visit
Admission: RE | Admit: 2014-10-29 | Discharge: 2014-10-29 | Disposition: A | Discharge status: Home / Self Care | Payer: Medicare Other | Source: Ambulatory Visit | Attending: Nurse Practitioner | Admitting: Nurse Practitioner

## 2014-10-29 ENCOUNTER — Other Ambulatory Visit: Payer: Self-pay | Admitting: Nurse Practitioner

## 2014-10-29 DIAGNOSIS — J40 Bronchitis, not specified as acute or chronic: Secondary | ICD-10-CM

## 2015-12-06 ENCOUNTER — Other Ambulatory Visit (HOSPITAL_COMMUNITY): Payer: Self-pay | Admitting: Urology

## 2015-12-06 ENCOUNTER — Ambulatory Visit (HOSPITAL_COMMUNITY)
Admission: RE | Admit: 2015-12-06 | Discharge: 2015-12-06 | Disposition: A | Discharge status: Home / Self Care | Payer: Medicare Other | Source: Ambulatory Visit | Attending: Urology | Admitting: Urology

## 2015-12-06 DIAGNOSIS — C642 Malignant neoplasm of left kidney, except renal pelvis: Secondary | ICD-10-CM

## 2015-12-06 DIAGNOSIS — R918 Other nonspecific abnormal finding of lung field: Secondary | ICD-10-CM | POA: Diagnosis not present

## 2016-07-29 ENCOUNTER — Ambulatory Visit (INDEPENDENT_AMBULATORY_CARE_PROVIDER_SITE_OTHER): Payer: Medicare Other

## 2016-07-29 ENCOUNTER — Encounter (INDEPENDENT_AMBULATORY_CARE_PROVIDER_SITE_OTHER): Payer: Self-pay | Admitting: Orthopaedic Surgery

## 2016-07-29 ENCOUNTER — Ambulatory Visit (INDEPENDENT_AMBULATORY_CARE_PROVIDER_SITE_OTHER): Payer: Medicare Other | Admitting: Orthopaedic Surgery

## 2016-07-29 VITALS — Ht 62.0 in | Wt 150.0 lb

## 2016-07-29 DIAGNOSIS — M25552 Pain in left hip: Secondary | ICD-10-CM | POA: Diagnosis not present

## 2016-07-29 DIAGNOSIS — M7062 Trochanteric bursitis, left hip: Secondary | ICD-10-CM

## 2016-07-29 DIAGNOSIS — M25551 Pain in right hip: Secondary | ICD-10-CM

## 2016-07-29 DIAGNOSIS — M7061 Trochanteric bursitis, right hip: Secondary | ICD-10-CM | POA: Diagnosis not present

## 2016-07-29 MED ORDER — LIDOCAINE HCL 1 % IJ SOLN
3.0000 mL | INTRAMUSCULAR | Status: AC | PRN
  Administered 2016-07-29: 3 mL

## 2016-07-29 MED ORDER — DICLOFENAC SODIUM 1 % TD GEL: 2.0000 g | Freq: Four times a day (QID) | TRANSDERMAL | 3 refills | Status: DC

## 2016-07-29 MED ORDER — METHYLPREDNISOLONE ACETATE 40 MG/ML IJ SUSP
40.0000 mg | INTRAMUSCULAR | Status: AC | PRN
Start: 2016-07-29 — End: 2016-07-29
  Administered 2016-07-29: 40 mg via INTRA_ARTICULAR

## 2016-07-29 MED ORDER — METHYLPREDNISOLONE ACETATE 40 MG/ML IJ SUSP
40.0000 mg | INTRAMUSCULAR | Status: AC | PRN
  Administered 2016-07-29: 40 mg via INTRA_ARTICULAR

## 2016-07-29 NOTE — Progress Notes (Signed)
Office Visit Note   Patient: Julia Perkins           Date of Birth: 1946/05/04           MRN: [id number] Visit Date: 07/29/2016              Requested by: Lajean Manes, MD [address] [place], [place] [address] PCP: Mathews Argyle, MD   Assessment & Plan: Visit Diagnoses:  1. Pain in left hip   2. Pain in right hip   3. Trochanteric bursitis, right hip   4. Trochanteric bursitis, left hip     Plan: She tolerated injections in both her trochanteric bursa areas easily. I will try sending him some Voltaren gel for her. I did show her stretching exercises to try. She'll follow up as needed.  Follow-Up Instructions: Return if symptoms worsen or fail to improve.   Orders:  Orders Placed This Encounter  Procedures  . Large Joint Injection/Arthrocentesis  . Large Joint Injection/Arthrocentesis  . XR HIP UNILAT W OR W/O PELVIS 2-3 VIEWS LEFT   No orders of the defined types were placed in this encounter.     Procedures: Large Joint Inj Date/Time: 07/29/2016 11:19 AM Performed by: Mcarthur Rossetti Authorized by: Jean Rosenthal Y   Location:  Hip Site:  L greater trochanter Ultrasound Guidance: No   Fluoroscopic Guidance: No   Arthrogram: No Medications:  3 mL lidocaine 1 %; 40 mg methylPREDNISolone acetate 40 MG/ML Large Joint Inj Date/Time: 07/29/2016 11:19 AM Performed by: Mcarthur Rossetti Authorized by: Mcarthur Rossetti   Location:  Hip Site:  R greater trochanter Ultrasound Guidance: No   Fluoroscopic Guidance: No   Arthrogram: No Medications:  3 mL lidocaine 1 %; 40 mg methylPREDNISolone acetate 40 MG/ML     Clinical Data: No additional findings.   Subjective: Chief Complaint  Patient presents with  . Right Hip - Pain    Bilateral hip pain. Right worse than the left. Prior history of hip trochanteric bursitis. She has had injection 10/2011 and this provided her good relief. Requests repeat injections  today if possible bilaterally. Pain lateral hip bil.  . Left Hip - Pain    Bilateral hip pain. Right worse than the left. Prior history of hip trochanteric bursitis. She has had injection 10/2011 and this provided her good relief. Requests repeat injections today if possible bilaterally. Pain lateral hip bil.    HPI  Review of Systems   Objective: Vital Signs: Ht 5\' 2"  (1.575 m)   Wt 150 lb (68 kg)   BMI 27.44 kg/m   Physical Exam  Ortho Exam She has a normal hip exam with fluid range of motion of both her hips. Her pain is only over the trochanteric area to palpation on both hips. Her back, knee, foot and ankle exam are all normal. Specialty Comments:  No specialty comments available.  Imaging: No results found.   PMFS History: Patient Active Problem List   Diagnosis Date Noted  . Ventral hernia 06/17/2011   Past Medical History:  Diagnosis Date  . Cancer (HCC)    renal cell carcinoma  . Chronic kidney disease   . GERD (gastroesophageal reflux disease)   . Hyperlipidemia   . Hypertension   . Hypothyroidism   . Plantar fasciitis   . S/P abdominoplasty   . Thyroid disease     Family History  Problem Relation Age of Onset  . Cancer Brother     kidney  Past Surgical History:  Procedure Laterality Date  . ABDOMINAL HYSTERECTOMY    . ABDOMINOPLASTY  2008  . CESAREAN SECTION    . CHOLECYSTECTOMY    . ELBOW SURGERY  90?   left  . FACELIFT  2006  . HERNIA REPAIR  2012  . KIDNEY SURGERY    . NECK SURGERY  80's   Social History   Occupational History  . Not on file.   Social History Main Topics  . Smoking status: Former Smoker    Quit date: 09/21/1996  . Smokeless tobacco: Never Used  . Alcohol use No  . Drug use: No  . Sexual activity: Not on file

## 2016-12-23 ENCOUNTER — Encounter (HOSPITAL_COMMUNITY): Payer: Self-pay

## 2016-12-23 ENCOUNTER — Emergency Department (HOSPITAL_COMMUNITY)
Admission: EM | Admit: 2016-12-23 | Discharge: 2016-12-24 | Disposition: A | Discharge status: Home / Self Care | Payer: Medicare Other | Attending: Emergency Medicine | Admitting: Emergency Medicine

## 2016-12-23 DIAGNOSIS — Z87891 Personal history of nicotine dependence: Secondary | ICD-10-CM | POA: Diagnosis not present

## 2016-12-23 DIAGNOSIS — Z85528 Personal history of other malignant neoplasm of kidney: Secondary | ICD-10-CM | POA: Insufficient documentation

## 2016-12-23 DIAGNOSIS — Z7982 Long term (current) use of aspirin: Secondary | ICD-10-CM | POA: Insufficient documentation

## 2016-12-23 DIAGNOSIS — R109 Unspecified abdominal pain: Secondary | ICD-10-CM

## 2016-12-23 DIAGNOSIS — I129 Hypertensive chronic kidney disease with stage 1 through stage 4 chronic kidney disease, or unspecified chronic kidney disease: Secondary | ICD-10-CM | POA: Diagnosis not present

## 2016-12-23 DIAGNOSIS — N189 Chronic kidney disease, unspecified: Secondary | ICD-10-CM | POA: Insufficient documentation

## 2016-12-23 DIAGNOSIS — E039 Hypothyroidism, unspecified: Secondary | ICD-10-CM | POA: Diagnosis not present

## 2016-12-23 DIAGNOSIS — Z79899 Other long term (current) drug therapy: Secondary | ICD-10-CM | POA: Insufficient documentation

## 2016-12-23 DIAGNOSIS — N3000 Acute cystitis without hematuria: Secondary | ICD-10-CM | POA: Diagnosis not present

## 2016-12-23 NOTE — ED Triage Notes (Signed)
Pain in left flank that started last night, worse through the day, nausea, no vomiting.

## 2016-12-24 ENCOUNTER — Emergency Department (HOSPITAL_COMMUNITY): Payer: Medicare Other

## 2016-12-24 LAB — URINALYSIS, ROUTINE W REFLEX MICROSCOPIC
Bacteria, UA: NONE SEEN
Bilirubin Urine: NEGATIVE
GLUCOSE, UA: NEGATIVE mg/dL
KETONES UR: NEGATIVE mg/dL
Nitrite: NEGATIVE
PH: 5 (ref 5.0–8.0)
Protein, ur: NEGATIVE mg/dL
Specific Gravity, Urine: 1.017 (ref 1.005–1.030)

## 2016-12-24 MED ORDER — HYDROCODONE-ACETAMINOPHEN 5-325 MG PO TABS
2.0000 | ORAL_TABLET | Freq: Once | ORAL | Status: AC
  Administered 2016-12-24: 2 via ORAL
  Filled 2016-12-24: qty 2

## 2016-12-24 MED ORDER — HYDROCODONE-ACETAMINOPHEN 5-325 MG PO TABS: 1.0000 | ORAL_TABLET | Freq: Four times a day (QID) | ORAL | 0 refills | Status: DC | PRN

## 2016-12-24 MED ORDER — HYDROMORPHONE HCL 1 MG/ML IJ SOLN
1.0000 mg | Freq: Once | INTRAMUSCULAR | Status: AC
  Administered 2016-12-24: 1 mg via INTRAMUSCULAR
  Filled 2016-12-24: qty 1

## 2016-12-24 MED ORDER — CEPHALEXIN 500 MG PO CAPS: 500.0000 mg | ORAL_CAPSULE | Freq: Two times a day (BID) | ORAL | 0 refills | Status: DC

## 2016-12-24 MED ORDER — CEPHALEXIN 500 MG PO CAPS
500.0000 mg | ORAL_CAPSULE | Freq: Once | ORAL | Status: AC
  Administered 2016-12-24: 500 mg via ORAL
  Filled 2016-12-24: qty 1

## 2016-12-24 NOTE — Discharge Instructions (Signed)

## 2016-12-24 NOTE — ED Provider Notes (Signed)
[facility] DEPT Provider Note   CSN: [id number] Arrival date & time: 12/23/16  2322  By signing my name below, I, Margit Banda, attest that this documentation has been prepared under the direction and in the presence of Ripley Fraise, MD. Electronically Signed: Margit Banda, ED Scribe. 12/24/16. 12:16 AM.   History   Chief Complaint Chief Complaint  Patient presents with  . Flank Pain    HPI Julia Perkins is a 71 y.o. female with a PMHx of Renal cell carcinoma, chronic kidney disease, HTN, and HLD, who presents to the Emergency Department complaining of sudden left flank pain that started the evening of 12/22/16. Pt notes she was gardening prior to onset and did not notice any immediate pain after. In 2011 pt reports having her left kidney removed. Pt denies CP, cough, vomiting, dysuria, diarrhea, weakness and numbness.   The history is provided by the patient. No language interpreter was used.  Flank Pain  This is a new problem. The current episode started 2 days ago. The problem occurs constantly. The problem has not changed since onset.Pertinent negatives include no chest pain and no shortness of breath. The symptoms are aggravated by twisting, bending and exertion. Nothing relieves the symptoms. She has tried nothing for the symptoms. The treatment provided no relief.    Past Medical History:  Diagnosis Date  . Cancer (HCC)    renal cell carcinoma  . Chronic kidney disease   . GERD (gastroesophageal reflux disease)   . Hyperlipidemia   . Hypertension   . Hypothyroidism   . Plantar fasciitis   . S/P abdominoplasty   . Thyroid disease     Patient Active Problem List   Diagnosis Date Noted  . Ventral hernia 06/17/2011    Past Surgical History:  Procedure Laterality Date  . ABDOMINAL HYSTERECTOMY    . ABDOMINOPLASTY  2008  . CESAREAN SECTION    . CHOLECYSTECTOMY    . ELBOW SURGERY  90?   left  . FACELIFT  2006  . HERNIA REPAIR  2012  . KIDNEY SURGERY     . NECK SURGERY  80's    OB History    No data available       Home Medications    Prior to Admission medications   Medication Sig Start Date End Date Taking? Authorizing Provider  amLODipine (NORVASC) 2.5 MG tablet  07/04/16  Yes Historical Provider, MD  aspirin 81 MG tablet Take 81 mg by mouth daily.     Yes Historical Provider, MD  hydrochlorothiazide (HYDRODIURIL) 25 MG tablet daily. 05/21/11  Yes Historical Provider, MD  Levothyroxine Sodium 100 MCG CAPS Take by mouth.     Yes Historical Provider, MD  ALPRAZolam Duanne Moron) 0.25 MG tablet Take 0.25 mg by mouth at bedtime as needed.      Historical Provider, MD  atenolol (TENORMIN) 50 MG tablet Take 50 mg by mouth daily.      Historical Provider, MD  diclofenac sodium (VOLTAREN) 1 % GEL Apply 2 g topically 4 (four) times daily. 07/29/16   Mcarthur Rossetti, MD  fish oil-omega-3 fatty acids 1000 MG capsule Take 2 g by mouth daily.      Historical Provider, MD  meloxicam (MOBIC) 15 MG tablet Take 1 tablet (15 mg total) by mouth daily. Patient not taking: Reported on 07/29/2016 07/06/13   Tamala Fothergill Regal, DPM  rosuvastatin (CRESTOR) 5 MG tablet Take 5 mg by mouth daily.      Historical Provider, MD  Family History Family History  Problem Relation Age of Onset  . Cancer Brother     kidney    Social History Social History  Substance Use Topics  . Smoking status: Former Smoker    Quit date: 09/21/1996  . Smokeless tobacco: Never Used  . Alcohol use No     Allergies   Crestor [rosuvastatin]; Lipitor [atorvastatin]; and Simvastatin   Review of Systems Review of Systems  Constitutional: Negative for fever.  Respiratory: Negative for cough and shortness of breath.   Cardiovascular: Negative for chest pain.  Gastrointestinal: Negative for diarrhea and vomiting.  Genitourinary: Positive for flank pain. Negative for dysuria.  Musculoskeletal: Positive for myalgias.  Neurological: Negative for weakness and numbness.  All  other systems reviewed and are negative.    Physical Exam Updated Vital Signs BP (!) 181/65 (BP Location: Left Arm)   Pulse 66   Temp 98.1 F (36.7 C) (Oral)   Resp 18   Ht 5\' 2"  (1.575 m)   Wt 150 lb (68 kg)   SpO2 100%   BMI 27.44 kg/m   Physical Exam  CONSTITUTIONAL: Well developed/well nourished HEAD: Normocephalic/atraumatic EYES: EOMI/PERRL ENMT: Mucous membranes moist NECK: supple no meningeal signs SPINE/BACK:entire spine nontender CV: S1/S2 noted, no murmurs/rubs/gallops noted LUNGS: Lungs are clear to auscultation bilaterally, no apparent distress CHEST: tenderness to left lateral ribs. No erythema or bruising noted. No crepitus noted.  No rash noted   ABDOMEN: soft, nontender, no LUQ tenderness GU:no cva tenderness NEURO: Pt is awake/alert/appropriate, moves all extremitiesx4.  No facial droop. No focal weakness of the LE, equal power noted in both legs.   EXTREMITIES: pulses normal/equal, full ROM SKIN: warm, color normal PSYCH: no abnormalities of mood noted, alert and oriented to situation  ED Treatments / Results  DIAGNOSTIC STUDIES: Oxygen Saturation is 100% on RA, normal by my interpretation.   COORDINATION OF CARE: 12:10 AM-Discussed next steps with pt which includes a CXR. Pt verbalized understanding and is agreeable with the plan.    Labs (all labs ordered are listed, but only abnormal results are displayed) Labs Reviewed  URINALYSIS, ROUTINE W REFLEX MICROSCOPIC - Abnormal; Notable for the following:       Result Value   APPearance HAZY (*)    Hgb urine dipstick SMALL (*)    Leukocytes, UA LARGE (*)    Squamous Epithelial / LPF 6-30 (*)    Non Squamous Epithelial 6-30 (*)    All other components within normal limits  URINE CULTURE    EKG  EKG Interpretation None       Radiology Dg Ribs Unilateral W/chest Left  Result Date: 12/24/2016 CLINICAL DATA:  Left flank pain, onset yesterday. EXAM: LEFT RIBS AND CHEST - 3+ VIEW  COMPARISON:  12/06/2015 FINDINGS: Left rib detail images are negative for fracture or other acute bony abnormality. No bone lesion or bony destruction. The PA view of the chest is normal. The lungs are clear. The pulmonary vasculature is normal. Hilar and mediastinal contours are unremarkable unchanged. Heart size is normal. No pleural effusion. IMPRESSION: Negative. Electronically Signed   By: Andreas Newport M.D.   On: 12/24/2016 01:00    Procedures Procedures (including critical care time)  Medications Ordered in ED Medications  HYDROcodone-acetaminophen (NORCO/VICODIN) 5-325 MG per tablet 2 tablet (2 tablets Oral Given 12/24/16 0017)  cephALEXin (KEFLEX) capsule 500 mg (500 mg Oral Given 12/24/16 0122)  HYDROmorphone (DILAUDID) injection 1 mg (1 mg Intramuscular Given 12/24/16 0123)     Initial Impression /  Assessment and Plan / ED Course  I have reviewed the triage vital signs and the nursing notes.  Pertinent labs & imaging results that were available during my care of the patient were reviewed by me and considered in my medical decision making (see chart for details).     Pt stable She had focal left lower rib tenderness to palpation She denied actual CP/SOB Lungs were clear on exam CXR negative She was noted to have UTI, though she is s/p LEFT radical nephrectomy, so pyelo is less likely  She is well appearing/improved I don't feel emergent CT imaging required Will treat with short course of vicodin and keflex for UTI Narcotic database reviewed and considered in decision making We discussed strict ER return precautions   Final Clinical Impressions(s) / ED Diagnoses   Final diagnoses:  Left flank pain  Acute cystitis without hematuria    New Prescriptions New Prescriptions   CEPHALEXIN (KEFLEX) 500 MG CAPSULE    Take 1 capsule (500 mg total) by mouth 2 (two) times daily.   HYDROCODONE-ACETAMINOPHEN (NORCO/VICODIN) 5-325 MG TABLET    Take 1 tablet by mouth every 6 (six)  hours as needed for severe pain.   I personally performed the services described in this documentation, which was scribed in my presence. The recorded information has been reviewed and is accurate.       Ripley Fraise, MD 12/24/16 [id number]

## 2016-12-25 LAB — URINE CULTURE

## 2016-12-26 ENCOUNTER — Telehealth: Payer: Self-pay

## 2016-12-26 NOTE — Telephone Encounter (Signed)
Post ED Visit - Positive Culture Follow-up  Culture report reviewed by antimicrobial stewardship pharmacist:  []  Elenor Quinones, Pharm.D. []  Heide Guile, Pharm.D., BCPS AQ-ID []  Parks Neptune, Pharm.D., BCPS []  Alycia Rossetti, Pharm.D., BCPS []  [place], Pharm.D., BCPS, AAHIVP []  Legrand Como, Pharm.D., BCPS, AAHIVP []  Salome Arnt, PharmD, BCPS []  Dimitri Ped, PharmD, BCPS []  Vincenza Hews, PharmD, BCPS [facility] Pharm D Positive urine culture Treated with Cephalexin, organism sensitive to the same and no further patient follow-up is required at this time.  Genia Del 12/26/2016, 9:08 AM

## 2017-03-18 ENCOUNTER — Ambulatory Visit (HOSPITAL_COMMUNITY)
Admission: EM | Admit: 2017-03-18 | Discharge: 2017-03-18 | Disposition: A | Discharge status: Home / Self Care | Payer: Medicare Other | Attending: Internal Medicine | Admitting: Internal Medicine

## 2017-03-18 ENCOUNTER — Encounter (HOSPITAL_COMMUNITY): Payer: Self-pay | Admitting: *Deleted

## 2017-03-18 DIAGNOSIS — S63501A Unspecified sprain of right wrist, initial encounter: Secondary | ICD-10-CM | POA: Diagnosis not present

## 2017-03-18 DIAGNOSIS — M25531 Pain in right wrist: Secondary | ICD-10-CM

## 2017-03-18 NOTE — ED Provider Notes (Signed)
CSN: [id number]     Arrival date & time 03/18/17  1418 History   None    Chief Complaint  Patient presents with  . Wrist Pain   (Consider location/radiation/quality/duration/timing/severity/associated sxs/prior Treatment) 71 yo female comes in with 1 week history of right wrist swelling and pain. She lifted a heavy bucket 2 weeks ago, and thinks the wrist pain is due to that. No pain at rest. Pain is exacerbated by certain movements, especially with twisting of the wrist. Has not taken anything for the pain. Denies numbness/tingling. Denies increased warmth, redness, discharge.   Patient with one kidney left due to renal carcinoma. Would like to avoid NSAIDs.        Past Medical History:  Diagnosis Date  . Cancer (HCC)    renal cell carcinoma  . Chronic kidney disease   . GERD (gastroesophageal reflux disease)   . Hyperlipidemia   . Hypertension   . Hypothyroidism   . Plantar fasciitis   . S/P abdominoplasty   . Thyroid disease    Past Surgical History:  Procedure Laterality Date  . ABDOMINAL HYSTERECTOMY    . ABDOMINOPLASTY  2008  . CESAREAN SECTION    . CHOLECYSTECTOMY    . ELBOW SURGERY  90?   left  . FACELIFT  2006  . HERNIA REPAIR  2012  . KIDNEY SURGERY    . NECK SURGERY  80's   Family History  Problem Relation Age of Onset  . Cancer Brother        kidney   Social History  Substance Use Topics  . Smoking status: Former Smoker    Quit date: 09/21/1996  . Smokeless tobacco: Never Used  . Alcohol use No   OB History    No data available     Review of Systems  Constitutional: Negative for chills, diaphoresis and fever.  Respiratory: Negative for shortness of breath and wheezing.   Cardiovascular: Negative for chest pain and palpitations.  Musculoskeletal: Positive for arthralgias, joint swelling and myalgias.  Skin: Negative for wound.  Neurological: Negative for numbness.    Allergies  Crestor [rosuvastatin]; Lipitor [atorvastatin]; and  Simvastatin  Home Medications   Prior to Admission medications   Medication Sig Start Date End Date Taking? Authorizing Provider  ALPRAZolam (XANAX) 0.25 MG tablet Take 0.25 mg by mouth at bedtime as needed.      [provider]  amLODipine (NORVASC) 2.5 MG tablet  07/04/16   [provider]  aspirin 81 MG tablet Take 81 mg by mouth daily.      [provider]  atenolol (TENORMIN) 50 MG tablet Take 50 mg by mouth daily.      [provider]  cephALEXin (KEFLEX) 500 MG capsule Take 1 capsule (500 mg total) by mouth 2 (two) times daily. 12/24/16   Ripley Fraise, MD  diclofenac sodium (VOLTAREN) 1 % GEL Apply 2 g topically 4 (four) times daily. 07/29/16   Mcarthur Rossetti, MD  fish oil-omega-3 fatty acids 1000 MG capsule Take 2 g by mouth daily.      [provider]  hydrochlorothiazide (HYDRODIURIL) 25 MG tablet daily. 05/21/11   [provider]  HYDROcodone-acetaminophen (NORCO/VICODIN) 5-325 MG tablet Take 1 tablet by mouth every 6 (six) hours as needed for severe pain. 12/24/16   Ripley Fraise, MD  Levothyroxine Sodium 100 MCG CAPS Take by mouth.      [provider]  meloxicam (MOBIC) 15 MG tablet Take 1 tablet (15 mg total) by mouth  daily. Patient not taking: Reported on 07/29/2016 07/06/13   Wallene Huh, DPM  rosuvastatin (CRESTOR) 5 MG tablet Take 5 mg by mouth daily.      [provider]   Meds Ordered and Administered this Visit  Medications - No data to display  BP (!) 166/87 (BP Location: Right Arm) Comment: rn notified  Pulse 80   Temp 98.5 F (36.9 C) (Oral)   Resp 16   SpO2 97%  No data found.   Physical Exam  Constitutional: She is oriented to person, place, and time. She appears well-developed and well-nourished. No distress.  HENT:  Head: Normocephalic and atraumatic.  Eyes: Conjunctivae are normal. Pupils are equal, round, and reactive to light.  Musculoskeletal:       Right elbow:  Normal.      Left elbow: Normal.       Left wrist: Normal.  Right wrist with swelling of the ulnar side of the wrist. No erythema, increased warmth, wound noted. Pain on palpation of the ulnar side, no pain on radial side. Decreased ROM due to pain, decreased strength due to pain. Sensation intact and equal bilaterally. Radial pulse 2+ equal bilaterally.   Neurological: She is alert and oriented to person, place, and time.  Skin: Skin is warm and dry.  Psychiatric: She has a normal mood and affect. Her behavior is normal. Judgment normal.    Urgent Care Course     Procedures (including critical care time)  Labs Review Labs Reviewed - No data to display  Imaging Review No results found.       MDM   1. Sprain of right wrist, initial encounter    1. Discussed with patient history and exam most consistent with sprained right wrist. Given patient's renal history, will avoid NSAIDs. Discussed with patient it may take a few weeks for symptoms to improve. Patient to take Tylenol for pain. Ice and rest the wrist. Wrist splint applied at visit to prevent further irritation to injury. Patient expresses understanding and agrees to plan.     Ok Edwards, PA-C 03/18/17 1555

## 2017-03-18 NOTE — ED Triage Notes (Signed)
Injured  Her  r   Wrist  2   Weeks  Ago  Lifting a  Heavy  Pail   Pain       And    Swelling     Pain is   Certain movements  And  posistions

## 2018-01-12 ENCOUNTER — Encounter (INDEPENDENT_AMBULATORY_CARE_PROVIDER_SITE_OTHER): Payer: Self-pay | Admitting: Orthopaedic Surgery

## 2018-01-12 ENCOUNTER — Ambulatory Visit (INDEPENDENT_AMBULATORY_CARE_PROVIDER_SITE_OTHER): Payer: Medicare Other | Admitting: Physician Assistant

## 2018-01-12 ENCOUNTER — Ambulatory Visit (INDEPENDENT_AMBULATORY_CARE_PROVIDER_SITE_OTHER): Payer: Medicare Other

## 2018-01-12 DIAGNOSIS — G8929 Other chronic pain: Secondary | ICD-10-CM

## 2018-01-12 DIAGNOSIS — M5442 Lumbago with sciatica, left side: Secondary | ICD-10-CM

## 2018-01-12 MED ORDER — METHYLPREDNISOLONE 4 MG PO TABS: ORAL_TABLET | ORAL | 0 refills | Status: DC

## 2018-01-12 MED ORDER — CYCLOBENZAPRINE HCL 5 MG PO TABS: 5.0000 mg | ORAL_TABLET | Freq: Every day | ORAL | 0 refills | Status: DC

## 2018-01-12 NOTE — Progress Notes (Signed)
Office Visit Note   Patient: Julia Perkins           Date of Birth: October 29, 1945           MRN: [id number] Visit Date: 01/12/2018              Requested by: Lajean Manes, MD [address] [place], [place] [address] PCP: Lajean Manes, MD   Assessment & Plan: Visit Diagnoses:  1. Chronic left-sided low back pain with left-sided sciatica     Plan: We will place her on a Medrol Dosepak have her take low-dose of Flexeril only at night.  We will send her to formal physical therapy for home exercise program, stretching, core strengthening and modalities.  She will follow with [place] in a month to check her progress lack of.  Follow-Up Instructions: Return in about 1 month (around 02/09/2018).   Orders:  Orders Placed This Encounter  Procedures  . XR Lumbar Spine 2-3 Views   Meds ordered this encounter  Medications  . methylPREDNISolone (MEDROL) 4 MG tablet    Sig: Take as directed    Dispense:  21 tablet    Refill:  0  . cyclobenzaprine (FLEXERIL) 5 MG tablet    Sig: Take 1 tablet (5 mg total) by mouth at bedtime.    Dispense:  40 tablet    Refill:  0      Procedures: No procedures performed   Clinical Data: No additional findings.   Subjective: Chief Complaint  Patient presents with  . Lower Back - Pain    HPI Julia Perkins comes in today due to low back pain for the past 2 weeks.  She had no known injury.  She states she has had no waking pain.  No bowel bladder dysfunction.  Pain does radiate down into her left lower leg at times.  She is taken some Tylenol use some heat on her lower back.  She has a history of renal cancer with a left nephrectomy.  I Review of Systems Denies any chills, chest pain, fevers, shortness of breath, increasing urinary frequency.  Please see HPI otherwise negative.  Objective: Vital Signs: There were no vitals taken for this visit.  Physical Exam  Constitutional: She is oriented to person, place, and time. She appears  well-developed and well-nourished. No distress.  Pulmonary/Chest: Effort normal.  Neurological: She is alert and oriented to person, place, and time.  Skin: She is not diaphoretic.  Psychiatric: She has a normal mood and affect.    Ortho Exam Lower extremities 5 out of 5 strength against resistance.  Negative straight leg raise bilaterally.  Deep tendon reflexes are 2+ at the knees and 1+ at the ankles and equal and symmetric.  Sensation grossly intact bilateral feet.  Dorsal pedal pulses are 2+ bilaterally.  Good range of motion of both hips without pain.  Tight hamstrings bilaterally.  Tenderness in the lower left paraspinous region. Specialty Comments:  No specialty comments available.  Imaging: Xr Lumbar Spine 2-3 Views  Result Date: 01/12/2018 Lumbar spine: Degenerative disc disease at L1-L5 S1.  Endplate spurring L to through L5.  No acute fractures.  Pelvis without acute fractures bilateral hips well maintained and well located.    PMFS History: Patient Active Problem List   Diagnosis Date Noted  . Ventral hernia 06/17/2011   Past Medical History:  Diagnosis Date  . Cancer (HCC)    renal cell carcinoma  . Chronic kidney disease   . GERD (  gastroesophageal reflux disease)   . Hyperlipidemia   . Hypertension   . Hypothyroidism   . Plantar fasciitis   . S/P abdominoplasty   . Thyroid disease     Family History  Problem Relation Age of Onset  . Cancer Brother        kidney    Past Surgical History:  Procedure Laterality Date  . ABDOMINAL HYSTERECTOMY    . ABDOMINOPLASTY  2008  . CESAREAN SECTION    . CHOLECYSTECTOMY    . ELBOW SURGERY  90?   left  . FACELIFT  2006  . HERNIA REPAIR  2012  . KIDNEY SURGERY    . NECK SURGERY  80's   Social History   Occupational History  . Not on file  Tobacco Use  . Smoking status: Former Smoker    Last attempt to quit: 09/21/1996    Years since quitting: 21.3  . Smokeless tobacco: Never Used  Substance and Sexual  Activity  . Alcohol use: No  . Drug use: No  . Sexual activity: Not on file

## 2018-02-09 ENCOUNTER — Ambulatory Visit (INDEPENDENT_AMBULATORY_CARE_PROVIDER_SITE_OTHER): Payer: Medicare Other | Admitting: Orthopaedic Surgery

## 2018-05-24 ENCOUNTER — Other Ambulatory Visit: Payer: Self-pay

## 2018-05-24 ENCOUNTER — Emergency Department (HOSPITAL_COMMUNITY): Payer: Medicare Other

## 2018-05-24 ENCOUNTER — Encounter (HOSPITAL_COMMUNITY): Payer: Self-pay | Admitting: Emergency Medicine

## 2018-05-24 DIAGNOSIS — I129 Hypertensive chronic kidney disease with stage 1 through stage 4 chronic kidney disease, or unspecified chronic kidney disease: Secondary | ICD-10-CM | POA: Diagnosis not present

## 2018-05-24 DIAGNOSIS — R0789 Other chest pain: Secondary | ICD-10-CM | POA: Diagnosis present

## 2018-05-24 DIAGNOSIS — Z87891 Personal history of nicotine dependence: Secondary | ICD-10-CM | POA: Diagnosis not present

## 2018-05-24 DIAGNOSIS — R1013 Epigastric pain: Secondary | ICD-10-CM | POA: Diagnosis not present

## 2018-05-24 DIAGNOSIS — N189 Chronic kidney disease, unspecified: Secondary | ICD-10-CM | POA: Insufficient documentation

## 2018-05-24 DIAGNOSIS — Z79899 Other long term (current) drug therapy: Secondary | ICD-10-CM | POA: Insufficient documentation

## 2018-05-24 DIAGNOSIS — E039 Hypothyroidism, unspecified: Secondary | ICD-10-CM | POA: Insufficient documentation

## 2018-05-24 DIAGNOSIS — Z85528 Personal history of other malignant neoplasm of kidney: Secondary | ICD-10-CM | POA: Insufficient documentation

## 2018-05-24 LAB — CBC
HCT: 37.5 % (ref 36.0–46.0)
Hemoglobin: 12.4 g/dL (ref 12.0–15.0)
MCH: 29.1 pg (ref 26.0–34.0)
MCHC: 33.1 g/dL (ref 30.0–36.0)
MCV: 88 fL (ref 78.0–100.0)
PLATELETS: 254 10*3/uL (ref 150–400)
RBC: 4.26 MIL/uL (ref 3.87–5.11)
RDW: 12.8 % (ref 11.5–15.5)
WBC: 6.7 10*3/uL (ref 4.0–10.5)

## 2018-05-24 LAB — BASIC METABOLIC PANEL
Anion gap: 10 (ref 5–15)
BUN: 20 mg/dL (ref 8–23)
CALCIUM: 9.2 mg/dL (ref 8.9–10.3)
CHLORIDE: 101 mmol/L (ref 98–111)
CO2: 31 mmol/L (ref 22–32)
CREATININE: 1.43 mg/dL — AB (ref 0.44–1.00)
GFR calc non Af Amer: 36 mL/min — ABNORMAL LOW (ref >60)
GFR, EST AFRICAN AMERICAN: 42 mL/min — AB (ref >60)
Glucose, Bld: 104 mg/dL — ABNORMAL HIGH (ref 70–99)
Potassium: 3.3 mmol/L — ABNORMAL LOW (ref 3.5–5.1)
SODIUM: 142 mmol/L (ref 135–145)

## 2018-05-24 LAB — TROPONIN I: Troponin I: <0.03 ng/mL (ref <0.03)

## 2018-05-24 NOTE — ED Triage Notes (Signed)
Pt states mid sternal CP started today while sitting today. Denies cardiac hx. Took two baby ASA today without relief.

## 2018-05-25 ENCOUNTER — Other Ambulatory Visit: Payer: Self-pay

## 2018-05-25 ENCOUNTER — Emergency Department (HOSPITAL_COMMUNITY)
Admission: EM | Admit: 2018-05-25 | Discharge: 2018-05-25 | Disposition: A | Discharge status: Home / Self Care | Payer: Medicare Other | Attending: Emergency Medicine | Admitting: Emergency Medicine

## 2018-05-25 ENCOUNTER — Emergency Department (HOSPITAL_COMMUNITY): Payer: Medicare Other

## 2018-05-25 DIAGNOSIS — R079 Chest pain, unspecified: Secondary | ICD-10-CM

## 2018-05-25 DIAGNOSIS — R1013 Epigastric pain: Secondary | ICD-10-CM

## 2018-05-25 LAB — HEPATIC FUNCTION PANEL
ALK PHOS: 96 U/L (ref 38–126)
ALT: 16 U/L (ref 0–44)
AST: 20 U/L (ref 15–41)
Albumin: 4.2 g/dL (ref 3.5–5.0)
Bilirubin, Direct: <0.1 mg/dL (ref 0.0–0.2)
TOTAL PROTEIN: 7.9 g/dL (ref 6.5–8.1)
Total Bilirubin: 0.5 mg/dL (ref 0.3–1.2)

## 2018-05-25 LAB — LIPASE, BLOOD: LIPASE: 58 U/L — AB (ref 11–51)

## 2018-05-25 LAB — TROPONIN I: Troponin I: <0.03 ng/mL (ref <0.03)

## 2018-05-25 MED ORDER — OMEPRAZOLE 20 MG PO CPDR: 20.0000 mg | DELAYED_RELEASE_CAPSULE | Freq: Every day | ORAL | 0 refills | Status: DC

## 2018-05-25 MED ORDER — IOHEXOL 300 MG/ML  SOLN
75.0000 mL | Freq: Once | INTRAMUSCULAR | Status: AC | PRN
  Administered 2018-05-25: 75 mL via INTRAVENOUS

## 2018-05-25 MED ORDER — GI COCKTAIL ~~LOC~~
30.0000 mL | Freq: Once | ORAL | Status: AC
Start: 2018-05-25 — End: 2018-05-25
  Administered 2018-05-25: 30 mL via ORAL
  Filled 2018-05-25: qty 30

## 2018-05-25 MED ORDER — SUCRALFATE 1 G PO TABS: 1.0000 g | ORAL_TABLET | Freq: Three times a day (TID) | ORAL | 0 refills | Status: DC

## 2018-05-25 NOTE — Discharge Instructions (Addendum)
There is no evidence of heart attack.  You are low risk for heart disease but not 0 risk.  You should follow-up with your doctor to have a repeat stress test.  Continue your omeprazole and add Carafate.  Avoid alcohol, caffeine, spicy foods, NSAID medications as they can all irritate the stomach.  Return to the ED if you develop chest pain that is worse with exertion, associated with sweating, nausea, vomiting, shortness of breath or any other concerns.

## 2018-05-25 NOTE — ED Notes (Signed)
Patient transported to X-ray 

## 2018-05-25 NOTE — ED Provider Notes (Signed)
[facility] EMERGENCY DEPARTMENT Provider Note   CSN: [id number] Arrival date & time: 05/24/18  1941     History   Chief Complaint Chief Complaint  Patient presents with  . Chest Pain    HPI Julia Perkins is a 72 y.o. female.  The history is provided by the patient. No language interpreter was used.  Chest Pain   This is a new problem. The current episode started yesterday. The problem occurs constantly. The problem has not changed since onset.The pain is associated with rest. The pain is present in the substernal region. The pain is moderate. The quality of the pain is described as brief. The pain does not radiate. Pertinent negatives include no abdominal pain. She has tried nothing for the symptoms. The treatment provided no relief. Risk factors include post-menopausal and being elderly.  Her past medical history is significant for hyperlipidemia and hypertension.  Procedure history is negative for cardiac catheterization.  Pt reports she began having mid chest pain today.  Pt reports pain in upper middle of chest right and left side.    Past Medical History:  Diagnosis Date  . Cancer (HCC)    renal cell carcinoma  . Chronic kidney disease   . GERD (gastroesophageal reflux disease)   . Hyperlipidemia   . Hypertension   . Hypothyroidism   . Plantar fasciitis   . S/P abdominoplasty   . Thyroid disease     Patient Active Problem List   Diagnosis Date Noted  . Ventral hernia 06/17/2011    Past Surgical History:  Procedure Laterality Date  . ABDOMINAL HYSTERECTOMY    . ABDOMINOPLASTY  2008  . CATARACT EXTRACTION    . CESAREAN SECTION    . CHOLECYSTECTOMY    . ELBOW SURGERY  90?   left  . FACELIFT  2006  . HERNIA REPAIR  2012  . KIDNEY SURGERY    . NECK SURGERY  80's     OB History   None      Home Medications    Prior to Admission medications   Medication Sig Start Date End Date Taking? Authorizing Provider  ALPRAZolam (XANAX) 0.25 MG tablet Take  0.25 mg by mouth at bedtime as needed.      [provider]  amLODipine (NORVASC) 2.5 MG tablet  07/04/16   [provider]  aspirin 81 MG tablet Take 81 mg by mouth daily.      [provider]  atenolol (TENORMIN) 50 MG tablet Take 50 mg by mouth daily.      [provider]  cephALEXin (KEFLEX) 500 MG capsule Take 1 capsule (500 mg total) by mouth 2 (two) times daily. 12/24/16   Ripley Fraise, MD  cyclobenzaprine (FLEXERIL) 5 MG tablet Take 1 tablet (5 mg total) by mouth at bedtime. 01/12/18   Pete Pelt, PA-C  diclofenac sodium (VOLTAREN) 1 % GEL Apply 2 g topically 4 (four) times daily. 07/29/16   Mcarthur Rossetti, MD  fish oil-omega-3 fatty acids 1000 MG capsule Take 2 g by mouth daily.      [provider]  hydrochlorothiazide (HYDRODIURIL) 25 MG tablet daily. 05/21/11   [provider]  HYDROcodone-acetaminophen (NORCO/VICODIN) 5-325 MG tablet Take 1 tablet by mouth every 6 (six) hours as needed for severe pain. 12/24/16   Ripley Fraise, MD  Levothyroxine Sodium 100 MCG CAPS Take by mouth.      [provider]  meloxicam (MOBIC) 15 MG tablet Take 1 tablet (15 mg total)  by mouth daily. Patient not taking: Reported on 07/29/2016 07/06/13   Wallene Huh, DPM  methylPREDNISolone (MEDROL) 4 MG tablet Take as directed 01/12/18   Pete Pelt, PA-C  rosuvastatin (CRESTOR) 5 MG tablet Take 5 mg by mouth daily.      [provider]    Family History Family History  Problem Relation Age of Onset  . Cancer Brother        kidney    Social History Social History   Tobacco Use  . Smoking status: Former Smoker    Last attempt to quit: 09/21/1996    Years since quitting: 21.6  . Smokeless tobacco: Never Used  Substance Use Topics  . Alcohol use: No  . Drug use: No     Allergies   Crestor [rosuvastatin]; Lipitor [atorvastatin]; and Simvastatin   Review of Systems Review of Systems  Cardiovascular:  Positive for chest pain.  Gastrointestinal: Negative for abdominal pain.  All other systems reviewed and are negative.    Physical Exam Updated Vital Signs BP (!) 160/56   Pulse 66   Temp 97.8 F (36.6 C) (Oral)   Resp 18   Ht 5\' 2"  (1.575 m)   Wt 74.4 kg   SpO2 97%   BMI 30.00 kg/m   Physical Exam  Constitutional: She appears well-developed and well-nourished.  HENT:  Head: Normocephalic and atraumatic.  Eyes: Pupils are equal, round, and reactive to light.  Neck: Normal range of motion.  Cardiovascular: Normal rate, regular rhythm, intact distal pulses and normal pulses.  Pulmonary/Chest: Effort normal and breath sounds normal. No respiratory distress. She has no decreased breath sounds.  Abdominal: Soft. There is no tenderness.  Musculoskeletal: Normal range of motion.       Right lower leg: Normal.  Neurological: She is alert.  Skin: Skin is warm.  Psychiatric: She has a normal mood and affect.  Nursing note and vitals reviewed.    ED Treatments / Results  Labs (all labs ordered are listed, but only abnormal results are displayed) Labs Reviewed  BASIC METABOLIC PANEL - Abnormal; Notable for the following components:      Result Value   Potassium 3.3 (*)    Glucose, Bld 104 (*)    Creatinine, Ser 1.43 (*)    GFR calc non Af Amer 36 (*)    GFR calc Af Amer 42 (*)    All other components within normal limits  CBC  TROPONIN I  HEPATIC FUNCTION PANEL  LIPASE, BLOOD  TROPONIN I    EKG EKG Interpretation  Date/Time:  Tuesday May 24 2018 19:49:20 EDT Ventricular Rate:  76 PR Interval:  164 QRS Duration: 62 QT Interval:  400 QTC Calculation: 450 R Axis:   8 Text Interpretation:  Normal sinus rhythm Low voltage QRS Cannot rule out Anterior infarct , age undetermined Abnormal ECG No STEMI.  Confirmed by Nanda Quinton [phone]) on 05/24/2018 8:08:08 PM   Radiology Dg Chest 2 View  Result Date: 05/24/2018 CLINICAL DATA:  Chest pain EXAM: CHEST - 2 VIEW  COMPARISON:  12/24/2016 FINDINGS: The heart size and mediastinal contours are within normal limits. Both lungs are clear. Mild osteophytes of the spine. Mild bronchitic changes. IMPRESSION: No active cardiopulmonary disease. Electronically Signed   By: Donavan Foil M.D.   On: 05/24/2018 20:36    Procedures Procedures (including critical care time)  Medications Ordered in ED Medications  gi cocktail (Maalox,Lidocaine,Donnatal) (has no administration in time range)     Initial Impression /  Assessment and Plan / ED Course  I have reviewed the triage vital signs and the nursing notes.  Pertinent labs & imaging results that were available during my care of the patient were reviewed by me and considered in my medical decision making (see chart for details).     MDM  Chest xray is normal, 1st troponin is normal.   Pt's care turned over to Dr. Wyvonnia Dusky at [facility]. Final Clinical Impressions(s) / ED Diagnoses   Final diagnoses:  Chest pain, unspecified type    ED Discharge Orders    None       Fransico Meadow, Hershal Coria 05/25/18 0100    Ezequiel Essex, MD 05/25/18 [phone]

## 2018-07-28 ENCOUNTER — Ambulatory Visit (INDEPENDENT_AMBULATORY_CARE_PROVIDER_SITE_OTHER): Payer: Medicare Other | Admitting: Physician Assistant

## 2018-10-12 ENCOUNTER — Encounter (INDEPENDENT_AMBULATORY_CARE_PROVIDER_SITE_OTHER): Payer: Self-pay | Admitting: Orthopaedic Surgery

## 2018-10-12 ENCOUNTER — Ambulatory Visit (INDEPENDENT_AMBULATORY_CARE_PROVIDER_SITE_OTHER): Payer: Medicare Other | Admitting: Orthopaedic Surgery

## 2018-10-12 ENCOUNTER — Ambulatory Visit (INDEPENDENT_AMBULATORY_CARE_PROVIDER_SITE_OTHER): Payer: Self-pay

## 2018-10-12 DIAGNOSIS — M542 Cervicalgia: Secondary | ICD-10-CM | POA: Diagnosis not present

## 2018-10-12 MED ORDER — GABAPENTIN 300 MG PO CAPS: 300.0000 mg | ORAL_CAPSULE | Freq: Every day | ORAL | 1 refills | Status: DC

## 2018-10-12 MED ORDER — METHOCARBAMOL 500 MG PO TABS: 500.0000 mg | ORAL_TABLET | Freq: Four times a day (QID) | ORAL | 0 refills | Status: DC | PRN

## 2018-10-12 MED ORDER — TRAMADOL HCL 50 MG PO TABS: 50.0000 mg | ORAL_TABLET | Freq: Four times a day (QID) | ORAL | 0 refills | Status: DC | PRN

## 2018-10-12 MED ORDER — METHYLPREDNISOLONE 4 MG PO TABS: ORAL_TABLET | ORAL | 0 refills | Status: DC

## 2018-10-12 NOTE — Progress Notes (Signed)
Office Visit Note   Patient: Julia Perkins           Date of Birth: [birth year]           MRN: [id number] Visit Date: 10/12/2018              Requested by: Lajean Manes, MD [address] [place], [place] [address] PCP: Lajean Manes, MD   Assessment & Plan: Visit Diagnoses:  1. Neck pain     Plan: I am going to start her on a combination of a 6-day steroid taper, 300 mg of Neurontin at bedtime, Robaxin and tramadol.  I talked her about getting an MRI of her cervical spine but she would like to just see how the medicines do first.  Obviously if she has acute worsening of her symptoms she will let us know.  She also let us know if things are not improving because I would absolutely like to obtain an MRI of the cervical spine to rule out herniated disc or new nerve impingement based on what she is describing.  All question concerns were answered and addressed.  Follow-Up Instructions: Return if symptoms worsen or fail to improve.   Orders:  Orders Placed This Encounter  Procedures  . XR Cervical Spine 2 or 3 views   Meds ordered this encounter  Medications  . methylPREDNISolone (MEDROL) 4 MG tablet    Sig: Medrol dose pack. Take as instructed    Dispense:  21 tablet    Refill:  0  . gabapentin (NEURONTIN) 300 MG capsule    Sig: Take 1 capsule (300 mg total) by mouth at bedtime.    Dispense:  30 capsule    Refill:  1  . methocarbamol (ROBAXIN) 500 MG tablet    Sig: Take 1 tablet (500 mg total) by mouth every 6 (six) hours as needed for muscle spasms.    Dispense:  60 tablet    Refill:  0  . traMADol (ULTRAM) 50 MG tablet    Sig: Take 1-2 tablets (50-100 mg total) by mouth every 6 (six) hours as needed.    Dispense:  40 tablet    Refill:  0      Procedures: No procedures performed   Clinical Data: No additional findings.   Subjective: Chief Complaint  Patient presents with  . Neck - Pain  The patient is a 73 year old female who comes in with  acute neck pain is been bothering her for a few days now and gotten quite significant.  She cannot sleep well at night.  She denies any significant arm weakness but does report some radiating pain down the right arm.  Most of her pains at the base of the cervical spine in the upper T-spine.  She has a remote history 20 years ago of a cervical spine fusion that was done at the mid to lower cervical spine.  This was for herniated disc.  She says it hurts that same way.  She says she is more comfortable at times with the right arm above her head.  She is not a diabetic.  She is only taking Tylenol for this discomfort.  HPI  Review of Systems She currently denies any headache, chest pain, shortness of breath, fever, chills, nausea, vomiting.  Objective: Vital Signs: There were no vitals taken for this visit.  Physical Exam She is alert and orient x3 and in no acute distress Ortho Exam Examination of her bilateral upper extremity shows just some  slight weakness in the triceps on the right.  There is no numbness and tingling in her hands and good grip strength.  She has painful range of motion of her cervical spine however pain is palpation at the base of the cervical spine in the upper T-spine. Specialty Comments:  No specialty comments available.  Imaging: Xr Cervical Spine 2 Or 3 Views  Result Date: 10/12/2018 2 views of the cervical spine show no acute findings.  There is no hardware in the neck but there is previous fusion between C5 and C7.    PMFS History: Patient Active Problem List   Diagnosis Date Noted  . Ventral hernia 06/17/2011   Past Medical History:  Diagnosis Date  . Cancer (HCC)    renal cell carcinoma  . Chronic kidney disease   . GERD (gastroesophageal reflux disease)   . Hyperlipidemia   . Hypertension   . Hypothyroidism   . Plantar fasciitis   . S/P abdominoplasty   . Thyroid disease     Family History  Problem Relation Age of Onset  . Cancer Brother         kidney    Past Surgical History:  Procedure Laterality Date  . ABDOMINAL HYSTERECTOMY    . ABDOMINOPLASTY  2008  . CATARACT EXTRACTION    . CESAREAN SECTION    . CHOLECYSTECTOMY    . ELBOW SURGERY  90?   left  . FACELIFT  2006  . HERNIA REPAIR  2012  . KIDNEY SURGERY    . NECK SURGERY  80's   Social History   Occupational History  . Not on file  Tobacco Use  . Smoking status: Former Smoker    Last attempt to quit: 09/21/1996    Years since quitting: 22.0  . Smokeless tobacco: Never Used  Substance and Sexual Activity  . Alcohol use: No  . Drug use: No  . Sexual activity: Not on file

## 2018-10-13 ENCOUNTER — Telehealth (INDEPENDENT_AMBULATORY_CARE_PROVIDER_SITE_OTHER): Payer: Self-pay

## 2018-10-13 ENCOUNTER — Other Ambulatory Visit (INDEPENDENT_AMBULATORY_CARE_PROVIDER_SITE_OTHER): Payer: Self-pay | Admitting: Orthopaedic Surgery

## 2018-10-13 MED ORDER — TIZANIDINE HCL 4 MG PO TABS: 4.0000 mg | ORAL_TABLET | Freq: Three times a day (TID) | ORAL | 0 refills | Status: DC | PRN

## 2018-10-13 NOTE — Telephone Encounter (Signed)
I sent in some Zanaflex. 

## 2018-10-13 NOTE — Telephone Encounter (Signed)
Can we change the Robaxin to something different? Her insurance won't cover

## 2019-03-30 ENCOUNTER — Other Ambulatory Visit: Payer: Medicare Other

## 2019-03-30 ENCOUNTER — Other Ambulatory Visit: Payer: Self-pay

## 2019-03-30 DIAGNOSIS — Z20822 Contact with and (suspected) exposure to covid-19: Secondary | ICD-10-CM

## 2019-04-04 LAB — NOVEL CORONAVIRUS, NAA: SARS-CoV-2, NAA: NOT DETECTED

## 2019-10-20 ENCOUNTER — Other Ambulatory Visit (HOSPITAL_COMMUNITY): Payer: Self-pay | Admitting: Geriatric Medicine

## 2019-10-20 DIAGNOSIS — R0789 Other chest pain: Secondary | ICD-10-CM

## 2019-10-31 ENCOUNTER — Encounter: Payer: Self-pay | Admitting: Orthopaedic Surgery

## 2019-10-31 ENCOUNTER — Ambulatory Visit: Payer: Medicare Other | Admitting: Orthopaedic Surgery

## 2019-10-31 ENCOUNTER — Other Ambulatory Visit: Payer: Self-pay

## 2019-10-31 ENCOUNTER — Telehealth (HOSPITAL_COMMUNITY): Payer: Self-pay

## 2019-10-31 DIAGNOSIS — Z981 Arthrodesis status: Secondary | ICD-10-CM

## 2019-10-31 DIAGNOSIS — M47812 Spondylosis without myelopathy or radiculopathy, cervical region: Secondary | ICD-10-CM | POA: Diagnosis not present

## 2019-10-31 MED ORDER — METHYLPREDNISOLONE 4 MG PO TABS: ORAL_TABLET | ORAL | 0 refills | Status: DC

## 2019-10-31 NOTE — Telephone Encounter (Signed)
Attempted  To contact the patient with instructions for her stress test on 11/02/2019. Will try again later. S.Maie Kesinger EMTP

## 2019-10-31 NOTE — Progress Notes (Signed)
Office Visit Note   Patient: Julia Perkins           Date of Birth: 16-Jul-1946           MRN: [id number] Visit Date: 10/31/2019              Requested by: Lajean Manes, MD [address] [place],  [place] [address] PCP: Lajean Manes, MD   Assessment & Plan: Visit Diagnoses:  1. History of fusion of cervical spine   2. Spondylosis without myelopathy or radiculopathy, cervical region     Plan: We will place patient on Medrol Dosepak she will return in 3 wks if she is having  persistent symptoms.  We discussed  cervical MRI imaging if she does not improve.  Her plain x-ray showed spondylitic changes at C4-5 above her solid fusion from 27 years ago.  Follow-Up Instructions: Return in about 3 weeks (around 11/21/2019).   Orders:  No orders of the defined types were placed in this encounter.  Meds ordered this encounter  Medications  . methylPREDNISolone (MEDROL) 4 MG tablet    Sig: Medrol dose pack. Take as instructed    Dispense:  21 tablet    Refill:  0      Procedures: No procedures performed   Clinical Data: No additional findings.   Subjective: Chief Complaint  Patient presents with  . Neck - Pain    HPI 74 year old female had previous cervical fusion by Dr. Ewell Poe 27 years ago C5-C7 noninstrumented fusion with allograft.  She states she had done well until 2 weeks ago she started having severe pain in her neck some was radiating into her arm right greater than left.  She got a heating pad was placed put it under close but put it directly on her neck and developed skin blisters.  She states she is doing somewhat better using Tylenol ES for relief.  Past problems with her neck a year ago she did not recall and at that point she got good relief with a Medrol Dosepak by Dr. Ninfa Linden.  Review of Systems positive for ventral hernia previous neck surgery as above.  Otherwise noncontributory to HPI she does have hypertension.   Objective: Vital  Signs: Ht 5\' 2"  (1.575 m)   Wt 159 lb (72.1 kg)   BMI 29.08 kg/m   Physical Exam Constitutional:      Appearance: She is well-developed.  HENT:     Head: Normocephalic.     Right Ear: External ear normal.     Left Ear: External ear normal.  Eyes:     Pupils: Pupils are equal, round, and reactive to light.  Neck:     Thyroid: No thyromegaly.     Trachea: No tracheal deviation.  Cardiovascular:     Rate and Rhythm: Normal rate.  Pulmonary:     Effort: Pulmonary effort is normal.  Abdominal:     Palpations: Abdomen is soft.  Skin:    General: Skin is warm and dry.  Neurological:     Mental Status: She is alert and oriented to person, place, and time.  Psychiatric:        Behavior: Behavior normal.     Ortho Exam patient has a positive brachial plexus tenderness on the right positive Spurling.  No tenderness on the left brachial plexus.  Upper extremity reflexes are 2+ and symmetrical well-healed anterior incision midline into the left.  Normal heel toe gait no hyperreflexia or clonus lower extremities.  Biceps  triceps wrist flexion extension is normal.  Specialty Comments:  No specialty comments available.  Imaging: No results found.   PMFS History: Patient Active Problem List   Diagnosis Date Noted  . History of fusion of cervical spine 10/31/2019  . Spondylosis without myelopathy or radiculopathy, cervical region 10/31/2019  . Ventral hernia 06/17/2011   Past Medical History:  Diagnosis Date  . Cancer (HCC)    renal cell carcinoma  . Chronic kidney disease   . GERD (gastroesophageal reflux disease)   . Hyperlipidemia   . Hypertension   . Hypothyroidism   . Plantar fasciitis   . S/P abdominoplasty   . Thyroid disease     Family History  Problem Relation Age of Onset  . Cancer Brother        kidney    Past Surgical History:  Procedure Laterality Date  . ABDOMINAL HYSTERECTOMY    . ABDOMINOPLASTY  2008  . CATARACT EXTRACTION    . CESAREAN SECTION      . CHOLECYSTECTOMY    . ELBOW SURGERY  90?   left  . FACELIFT  2006  . HERNIA REPAIR  2012  . KIDNEY SURGERY    . NECK SURGERY  80's   Social History   Occupational History  . Not on file  Tobacco Use  . Smoking status: Former Smoker    Quit date: 09/21/1996    Years since quitting: 23.1  . Smokeless tobacco: Never Used  Substance and Sexual Activity  . Alcohol use: No  . Drug use: No  . Sexual activity: Not on file

## 2019-11-01 ENCOUNTER — Telehealth (HOSPITAL_COMMUNITY): Payer: Self-pay

## 2019-11-01 NOTE — Telephone Encounter (Signed)
Attempted to contact the patient again. I did reach her voicemail and left instructions, asking to call back with any questions. Asked to call back with any questions. S.Talan Gildner EMTP

## 2019-11-02 ENCOUNTER — Encounter (HOSPITAL_COMMUNITY): Payer: Medicare Other

## 2019-11-21 ENCOUNTER — Ambulatory Visit: Payer: Medicare Other | Admitting: Orthopaedic Surgery

## 2019-11-23 ENCOUNTER — Encounter (HOSPITAL_COMMUNITY): Payer: Self-pay | Admitting: Geriatric Medicine

## 2019-12-07 ENCOUNTER — Encounter (HOSPITAL_COMMUNITY): Payer: Self-pay | Admitting: Geriatric Medicine

## 2019-12-21 ENCOUNTER — Telehealth (HOSPITAL_COMMUNITY): Payer: Self-pay | Admitting: Geriatric Medicine

## 2019-12-21 NOTE — Telephone Encounter (Signed)
Just an FYI. We have made several attempts to contact this patient including sending a letter to schedule or reschedule their Myocardial Perfusion test. We will be removing the patient from the echo [place].    12/07/19 Mailed letter/[facility]  11/23/19 Unable to LVM not set up/[facility]  11/20/19 Unable to LVM mailbox full/[facility] 11:55  11/02/19 Pt was a NS for her appt/ [facility] to R/S       Thank you

## 2020-08-14 ENCOUNTER — Ambulatory Visit: Payer: Medicare Other | Admitting: Physician Assistant

## 2020-08-14 ENCOUNTER — Encounter: Payer: Self-pay | Admitting: Physician Assistant

## 2020-08-14 ENCOUNTER — Ambulatory Visit: Payer: Self-pay

## 2020-08-14 DIAGNOSIS — M25532 Pain in left wrist: Secondary | ICD-10-CM | POA: Diagnosis not present

## 2020-08-14 NOTE — Progress Notes (Signed)
Office Visit Note   Patient: Julia Perkins           Date of Birth: [birth year]           MRN: [id number] Visit Date: 08/14/2020              Requested by: Lajean Manes, MD [address] [place],  [place] [address] PCP: Lajean Manes, MD   Assessment & Plan: Visit Diagnoses:  1. Pain in left wrist     Plan: Given patient's significant swelling has been ongoing for over a month and pain with normal radiographs recommend MRI of the left wrist to evaluate tendinitis versus bony pathology.  Have her follow-up after the MRI to go over results discuss further treatment.  Questions encouraged and answered.  She will continue wrist brace and Voltaren gel over the distal ulna 2 g up to 4 times daily.  Follow-Up Instructions: Return After MRI.   Orders:  Orders Placed This Encounter  Procedures  . XR Wrist 2 Views Left   No orders of the defined types were placed in this encounter.     Procedures: No procedures performed   Clinical Data: No additional findings.   Subjective: Chief Complaint  Patient presents with  . Left Wrist - Pain    HPI Julia Perkins 74 year old female were seen for the left wrist pain is been ongoing for at least a month and a half.  No known injury.  She notes swelling of the distal wrist.  She is wearing the Velcro wrist brace which is not helping.  She denies any numbness or tingling involving the left hand.    Review of Systems Negative for fevers or chills.  See HPI otherwise negative or noncontributory.  Objective: Vital Signs: There were no vitals taken for this visit.  Physical Exam Constitutional:      Appearance: She is normal weight. She is not ill-appearing.  Pulmonary:     Effort: Pulmonary effort is normal.  Neurological:     Mental Status: She is alert.  Psychiatric:        Mood and Affect: Mood normal.     Ortho Exam Left wrist she has soft tissue edema but no erythema particularly over the dorsal ulnar  aspect of the wrist.  She has full dorsiflexion volar flexion of the wrist without pain.  No pain with ulnar or radial deviation of the wrist.  She has maximal tenderness over the distal lateral ulna.  Remainder the wrist is nontender.  There is no ecchymosis.  Full sensation throughout left hand.  Full motor throughout left hand. Specialty Comments:  No specialty comments available.  Imaging: XR Wrist 2 Views Left  Result Date: 08/14/2020 Left wrist 3 views: No acute fractures no bony abnormalities.  Wrist joint is well located.    PMFS History: Patient Active Problem List   Diagnosis Date Noted  . History of fusion of cervical spine 10/31/2019  . Spondylosis without myelopathy or radiculopathy, cervical region 10/31/2019  . Ventral hernia 06/17/2011   Past Medical History:  Diagnosis Date  . Cancer (HCC)    renal cell carcinoma  . Chronic kidney disease   . GERD (gastroesophageal reflux disease)   . Hyperlipidemia   . Hypertension   . Hypothyroidism   . Plantar fasciitis   . S/P abdominoplasty   . Thyroid disease     Family History  Problem Relation Age of Onset  . Cancer Brother  kidney    Past Surgical History:  Procedure Laterality Date  . ABDOMINAL HYSTERECTOMY    . ABDOMINOPLASTY  2008  . CATARACT EXTRACTION    . CESAREAN SECTION    . CHOLECYSTECTOMY    . ELBOW SURGERY  90?   left  . FACELIFT  2006  . HERNIA REPAIR  2012  . KIDNEY SURGERY    . NECK SURGERY  80's   Social History   Occupational History  . Not on file  Tobacco Use  . Smoking status: Former Smoker    Quit date: 09/21/1996    Years since quitting: 23.9  . Smokeless tobacco: Never Used  Substance and Sexual Activity  . Alcohol use: No  . Drug use: No  . Sexual activity: Not on file

## 2020-10-24 DIAGNOSIS — K219 Gastro-esophageal reflux disease without esophagitis: Secondary | ICD-10-CM | POA: Diagnosis not present

## 2020-10-24 DIAGNOSIS — E890 Postprocedural hypothyroidism: Secondary | ICD-10-CM | POA: Diagnosis not present

## 2020-10-24 DIAGNOSIS — E7800 Pure hypercholesterolemia, unspecified: Secondary | ICD-10-CM | POA: Diagnosis not present

## 2020-10-24 DIAGNOSIS — I129 Hypertensive chronic kidney disease with stage 1 through stage 4 chronic kidney disease, or unspecified chronic kidney disease: Secondary | ICD-10-CM | POA: Diagnosis not present

## 2020-10-24 DIAGNOSIS — N1831 Chronic kidney disease, stage 3a: Secondary | ICD-10-CM | POA: Diagnosis not present

## 2021-01-09 ENCOUNTER — Ambulatory Visit
Admission: RE | Admit: 2021-01-09 | Discharge: 2021-01-09 | Disposition: A | Discharge status: Home / Self Care | Payer: Medicare Other | Source: Ambulatory Visit | Attending: Internal Medicine | Admitting: Internal Medicine

## 2021-01-09 ENCOUNTER — Other Ambulatory Visit: Payer: Self-pay | Admitting: Internal Medicine

## 2021-01-09 DIAGNOSIS — M545 Low back pain, unspecified: Secondary | ICD-10-CM | POA: Diagnosis not present

## 2021-01-09 DIAGNOSIS — Z9889 Other specified postprocedural states: Secondary | ICD-10-CM | POA: Diagnosis not present

## 2021-01-09 DIAGNOSIS — M79672 Pain in left foot: Secondary | ICD-10-CM

## 2021-01-09 DIAGNOSIS — M7732 Calcaneal spur, left foot: Secondary | ICD-10-CM | POA: Diagnosis not present

## 2021-01-09 DIAGNOSIS — M19072 Primary osteoarthritis, left ankle and foot: Secondary | ICD-10-CM | POA: Diagnosis not present

## 2021-01-15 DIAGNOSIS — I129 Hypertensive chronic kidney disease with stage 1 through stage 4 chronic kidney disease, or unspecified chronic kidney disease: Secondary | ICD-10-CM | POA: Diagnosis not present

## 2021-01-15 DIAGNOSIS — K219 Gastro-esophageal reflux disease without esophagitis: Secondary | ICD-10-CM | POA: Diagnosis not present

## 2021-01-15 DIAGNOSIS — E890 Postprocedural hypothyroidism: Secondary | ICD-10-CM | POA: Diagnosis not present

## 2021-01-15 DIAGNOSIS — N1831 Chronic kidney disease, stage 3a: Secondary | ICD-10-CM | POA: Diagnosis not present

## 2021-01-15 DIAGNOSIS — E7800 Pure hypercholesterolemia, unspecified: Secondary | ICD-10-CM | POA: Diagnosis not present

## 2021-01-17 ENCOUNTER — Ambulatory Visit: Payer: Medicare Other | Admitting: Podiatry

## 2021-01-17 ENCOUNTER — Ambulatory Visit (INDEPENDENT_AMBULATORY_CARE_PROVIDER_SITE_OTHER): Payer: Medicare Other

## 2021-01-17 ENCOUNTER — Encounter: Payer: Self-pay | Admitting: Podiatry

## 2021-01-17 ENCOUNTER — Other Ambulatory Visit: Payer: Self-pay

## 2021-01-17 DIAGNOSIS — M67972 Unspecified disorder of synovium and tendon, left ankle and foot: Secondary | ICD-10-CM

## 2021-01-17 DIAGNOSIS — M7672 Peroneal tendinitis, left leg: Secondary | ICD-10-CM

## 2021-01-17 DIAGNOSIS — M6790 Unspecified disorder of synovium and tendon, unspecified site: Secondary | ICD-10-CM | POA: Diagnosis not present

## 2021-01-17 MED ORDER — TRIAMCINOLONE ACETONIDE 10 MG/ML IJ SUSP
10.0000 mg | Freq: Once | INTRAMUSCULAR | Status: AC
  Administered 2021-01-17: 10 mg

## 2021-01-17 NOTE — Progress Notes (Signed)
Subjective:   Patient ID: Julia Perkins, female   DOB: 75 y.o.   MRN: [id number]   HPI Patient has developed intense discomfort around the base of the fifth metatarsal left with inflammation fluid at the insertion with no indication of tendon dysfunction.  Patient states she does not remember injury but states it is gotten worse and it is hard to walk with patient does not smoke likes to be active and was seen 8 years ago   Review of Systems  All other systems reviewed and are negative.       Objective:  Physical Exam Vitals and nursing note reviewed.  Constitutional:      Appearance: She is well-developed.  Pulmonary:     Effort: Pulmonary effort is normal.  Musculoskeletal:        General: Normal range of motion.  Skin:    General: Skin is warm.  Neurological:     Mental Status: She is alert.     Neurovascular status intact muscle strength found to be adequate range of motion adequate with patient found to have inflammation pain at base of fifth metatarsal left fluid buildup noted with no muscle strength loss.  Patient has good digital perfusion well oriented     Assessment:  Peroneal tendinitis left fifth metatarsal base with pain     Plan:  H&P x-rays reviewed condition discussed great length.  At this point I went ahead did sterile prep injected the sheath of the tendon near insertion 3 mg Dexasone Kenalog 5 mg Xylocaine and went ahead and I applied fascial brace to lift up the lateral side of the foot along with ice therapy support shoes.  Reappoint to recheck  X-rays indicate there is some reactivity around the base no other indications of pathology

## 2021-02-03 ENCOUNTER — Ambulatory Visit: Payer: Medicare Other | Admitting: Podiatry

## 2021-02-18 DIAGNOSIS — N1831 Chronic kidney disease, stage 3a: Secondary | ICD-10-CM | POA: Diagnosis not present

## 2021-02-18 DIAGNOSIS — I129 Hypertensive chronic kidney disease with stage 1 through stage 4 chronic kidney disease, or unspecified chronic kidney disease: Secondary | ICD-10-CM | POA: Diagnosis not present

## 2021-02-18 DIAGNOSIS — E7800 Pure hypercholesterolemia, unspecified: Secondary | ICD-10-CM | POA: Diagnosis not present

## 2021-02-18 DIAGNOSIS — K219 Gastro-esophageal reflux disease without esophagitis: Secondary | ICD-10-CM | POA: Diagnosis not present

## 2021-02-18 DIAGNOSIS — E890 Postprocedural hypothyroidism: Secondary | ICD-10-CM | POA: Diagnosis not present

## 2021-03-09 DIAGNOSIS — T63301A Toxic effect of unspecified spider venom, accidental (unintentional), initial encounter: Secondary | ICD-10-CM | POA: Diagnosis not present

## 2021-03-17 DIAGNOSIS — I129 Hypertensive chronic kidney disease with stage 1 through stage 4 chronic kidney disease, or unspecified chronic kidney disease: Secondary | ICD-10-CM | POA: Diagnosis not present

## 2021-03-17 DIAGNOSIS — E7800 Pure hypercholesterolemia, unspecified: Secondary | ICD-10-CM | POA: Diagnosis not present

## 2021-03-17 DIAGNOSIS — G451 Carotid artery syndrome (hemispheric): Secondary | ICD-10-CM | POA: Diagnosis not present

## 2021-03-17 DIAGNOSIS — N1831 Chronic kidney disease, stage 3a: Secondary | ICD-10-CM | POA: Diagnosis not present

## 2021-03-17 DIAGNOSIS — R7303 Prediabetes: Secondary | ICD-10-CM | POA: Diagnosis not present

## 2021-03-17 DIAGNOSIS — Z79899 Other long term (current) drug therapy: Secondary | ICD-10-CM | POA: Diagnosis not present

## 2021-03-19 ENCOUNTER — Other Ambulatory Visit: Payer: Self-pay | Admitting: Geriatric Medicine

## 2021-03-19 DIAGNOSIS — G451 Carotid artery syndrome (hemispheric): Secondary | ICD-10-CM

## 2021-03-28 DIAGNOSIS — I6523 Occlusion and stenosis of bilateral carotid arteries: Secondary | ICD-10-CM | POA: Diagnosis not present

## 2021-03-28 DIAGNOSIS — I771 Stricture of artery: Secondary | ICD-10-CM | POA: Diagnosis not present

## 2021-03-28 DIAGNOSIS — E041 Nontoxic single thyroid nodule: Secondary | ICD-10-CM | POA: Diagnosis not present

## 2021-03-28 DIAGNOSIS — G451 Carotid artery syndrome (hemispheric): Secondary | ICD-10-CM | POA: Diagnosis not present

## 2021-04-17 DIAGNOSIS — E7800 Pure hypercholesterolemia, unspecified: Secondary | ICD-10-CM | POA: Diagnosis not present

## 2021-04-17 DIAGNOSIS — N1831 Chronic kidney disease, stage 3a: Secondary | ICD-10-CM | POA: Diagnosis not present

## 2021-04-17 DIAGNOSIS — K219 Gastro-esophageal reflux disease without esophagitis: Secondary | ICD-10-CM | POA: Diagnosis not present

## 2021-04-17 DIAGNOSIS — E890 Postprocedural hypothyroidism: Secondary | ICD-10-CM | POA: Diagnosis not present

## 2021-04-17 DIAGNOSIS — I129 Hypertensive chronic kidney disease with stage 1 through stage 4 chronic kidney disease, or unspecified chronic kidney disease: Secondary | ICD-10-CM | POA: Diagnosis not present

## 2021-04-17 DIAGNOSIS — G451 Carotid artery syndrome (hemispheric): Secondary | ICD-10-CM | POA: Diagnosis not present

## 2021-04-21 DIAGNOSIS — U071 COVID-19: Secondary | ICD-10-CM | POA: Diagnosis not present

## 2021-04-28 ENCOUNTER — Other Ambulatory Visit: Payer: Medicare Other

## 2021-04-29 DIAGNOSIS — U071 COVID-19: Secondary | ICD-10-CM | POA: Diagnosis not present

## 2021-05-06 ENCOUNTER — Other Ambulatory Visit: Payer: Medicare Other

## 2021-05-22 ENCOUNTER — Ambulatory Visit
Admission: RE | Admit: 2021-05-22 | Discharge: 2021-05-22 | Disposition: A | Discharge status: Home / Self Care | Payer: Medicare Other | Source: Ambulatory Visit | Attending: Geriatric Medicine | Admitting: Geriatric Medicine

## 2021-05-22 ENCOUNTER — Other Ambulatory Visit: Payer: Self-pay

## 2021-05-22 DIAGNOSIS — G451 Carotid artery syndrome (hemispheric): Secondary | ICD-10-CM | POA: Diagnosis not present

## 2021-05-22 DIAGNOSIS — I639 Cerebral infarction, unspecified: Secondary | ICD-10-CM | POA: Diagnosis not present

## 2021-05-22 MED ORDER — GADOBUTROL 1 MMOL/ML IV SOLN
14.0000 mL | Freq: Once | INTRAVENOUS | Status: AC | PRN
  Administered 2021-05-22: 14 mL via INTRAVENOUS

## 2021-06-11 DIAGNOSIS — C44729 Squamous cell carcinoma of skin of left lower limb, including hip: Secondary | ICD-10-CM | POA: Diagnosis not present

## 2021-06-11 DIAGNOSIS — C44212 Basal cell carcinoma of skin of right ear and external auricular canal: Secondary | ICD-10-CM | POA: Diagnosis not present

## 2021-06-11 DIAGNOSIS — G451 Carotid artery syndrome (hemispheric): Secondary | ICD-10-CM | POA: Diagnosis not present

## 2021-06-11 DIAGNOSIS — L578 Other skin changes due to chronic exposure to nonionizing radiation: Secondary | ICD-10-CM | POA: Diagnosis not present

## 2021-06-11 DIAGNOSIS — E890 Postprocedural hypothyroidism: Secondary | ICD-10-CM | POA: Diagnosis not present

## 2021-06-11 DIAGNOSIS — I129 Hypertensive chronic kidney disease with stage 1 through stage 4 chronic kidney disease, or unspecified chronic kidney disease: Secondary | ICD-10-CM | POA: Diagnosis not present

## 2021-06-11 DIAGNOSIS — L821 Other seborrheic keratosis: Secondary | ICD-10-CM | POA: Diagnosis not present

## 2021-06-11 DIAGNOSIS — N1831 Chronic kidney disease, stage 3a: Secondary | ICD-10-CM | POA: Diagnosis not present

## 2021-06-11 DIAGNOSIS — C44319 Basal cell carcinoma of skin of other parts of face: Secondary | ICD-10-CM | POA: Diagnosis not present

## 2021-06-11 DIAGNOSIS — K219 Gastro-esophageal reflux disease without esophagitis: Secondary | ICD-10-CM | POA: Diagnosis not present

## 2021-06-11 DIAGNOSIS — L570 Actinic keratosis: Secondary | ICD-10-CM | POA: Diagnosis not present

## 2021-06-11 DIAGNOSIS — E7800 Pure hypercholesterolemia, unspecified: Secondary | ICD-10-CM | POA: Diagnosis not present

## 2021-06-24 ENCOUNTER — Ambulatory Visit: Payer: Self-pay

## 2021-06-24 ENCOUNTER — Ambulatory Visit: Payer: Medicare Other | Admitting: Orthopaedic Surgery

## 2021-06-24 ENCOUNTER — Encounter: Payer: Self-pay | Admitting: Orthopaedic Surgery

## 2021-06-24 DIAGNOSIS — M25562 Pain in left knee: Secondary | ICD-10-CM | POA: Diagnosis not present

## 2021-06-24 NOTE — Progress Notes (Signed)
Office Visit Note   Patient: Julia Perkins           Date of Birth: May 08, 1946           MRN: [id number] Visit Date: 06/24/2021              Requested by: Lajean Manes, MD [address] [place],  [place] [address] PCP: Lajean Manes, MD   Assessment & Plan: Visit Diagnoses:  1. Acute pain of left knee     Plan: Recommend quad strengthening exercises.  Use of Voltaren gel 4 g up to 4 times daily.  Discussed with her that this most likely represents a bony contusion and can take several months to resolve.  If she develops any mechanical symptoms she will let us know or if her pain persist beyond 3 months.  Questions were encouraged and answered by Dr. Ninfa Linden and myself.  Follow-Up Instructions: Return if symptoms worsen or fail to improve.   Orders:  Orders Placed This Encounter  Procedures   XR Knee 1-2 Views Left   No orders of the defined types were placed in this encounter.     Procedures: No procedures performed   Clinical Data: No additional findings.   Subjective: Chief Complaint  Patient presents with   Left Knee - Pain    HPI Mrs. Julia Perkins comes in today for left knee pain and swelling.  She states a month ago she fell down steps going into her garage came down directly on the left knee.  She states she has tried some gel on her knee but does not know the name of the gel is not really helping.  She is having some numbness tingling about the knee.  States that he just overall does not feel that it is improving. Denies any mechanical symptoms in the knee. Review of Systems Please see HPI otherwise negative or noncontributory.  Objective: Vital Signs: There were no vitals taken for this visit.  Physical Exam Constitutional:      Appearance: She is not ill-appearing or diaphoretic.  Pulmonary:     Effort: Pulmonary effort is normal.  Neurological:     Mental Status: She is alert and oriented to person, place, and time.  Psychiatric:         Mood and Affect: Mood normal.    Ortho Exam Left knee: No instability valgus varus stressing.  Full flexion /extension.  Nontender on the medial lateral joint line.  Tenderness over the tibial tubercle region.  Able to do straight leg raise without extension lag left knee.  Ecchymosis over the anterior aspect of the proximal tibial.  No abnormal warmth erythema.  No effusion. Specialty Comments:  No specialty comments available.  Imaging: XR Knee 1-2 Views Left  Result Date: 06/24/2021 Left knee 3 views: No acute fractures.  Patellofemoral arthritis.  No subluxation dislocation of the left knee.  Medial lateral joint lines overall well-maintained.  No bony abnormalities otherwise.    PMFS History: Patient Active Problem List   Diagnosis Date Noted   History of fusion of cervical spine 10/31/2019   Spondylosis without myelopathy or radiculopathy, cervical region 10/31/2019   Ventral hernia 06/17/2011   Past Medical History:  Diagnosis Date   Cancer ([place])    renal cell carcinoma   Chronic kidney disease    GERD (gastroesophageal reflux disease)    Hyperlipidemia    Hypertension    Hypothyroidism    Plantar fasciitis    S/P abdominoplasty  Thyroid disease     Family History  Problem Relation Age of Onset   Cancer Brother        kidney    Past Surgical History:  Procedure Laterality Date   ABDOMINAL HYSTERECTOMY     ABDOMINOPLASTY  2008   CATARACT EXTRACTION     CESAREAN SECTION     CHOLECYSTECTOMY     ELBOW SURGERY  90?   left   FACELIFT  2006   HERNIA REPAIR  2012   KIDNEY SURGERY     NECK SURGERY  80's   Social History   Occupational History   Not on file  Tobacco Use   Smoking status: Former    Types: Cigarettes    Quit date: 09/21/1996    Years since quitting: 24.7   Smokeless tobacco: Never  Substance and Sexual Activity   Alcohol use: No   Drug use: No   Sexual activity: Not on file

## 2021-06-25 DIAGNOSIS — C44212 Basal cell carcinoma of skin of right ear and external auricular canal: Secondary | ICD-10-CM | POA: Diagnosis not present

## 2021-06-25 DIAGNOSIS — C44319 Basal cell carcinoma of skin of other parts of face: Secondary | ICD-10-CM | POA: Diagnosis not present

## 2021-07-01 DIAGNOSIS — R519 Headache, unspecified: Secondary | ICD-10-CM | POA: Diagnosis not present

## 2021-07-01 DIAGNOSIS — J069 Acute upper respiratory infection, unspecified: Secondary | ICD-10-CM | POA: Diagnosis not present

## 2021-07-01 DIAGNOSIS — R110 Nausea: Secondary | ICD-10-CM | POA: Diagnosis not present

## 2021-07-01 DIAGNOSIS — Z20822 Contact with and (suspected) exposure to covid-19: Secondary | ICD-10-CM | POA: Diagnosis not present

## 2021-07-07 DIAGNOSIS — D0472 Carcinoma in situ of skin of left lower limb, including hip: Secondary | ICD-10-CM | POA: Diagnosis not present

## 2021-09-09 DIAGNOSIS — L570 Actinic keratosis: Secondary | ICD-10-CM | POA: Diagnosis not present

## 2021-09-09 DIAGNOSIS — C44329 Squamous cell carcinoma of skin of other parts of face: Secondary | ICD-10-CM | POA: Diagnosis not present

## 2021-09-16 DIAGNOSIS — J069 Acute upper respiratory infection, unspecified: Secondary | ICD-10-CM | POA: Diagnosis not present

## 2021-09-26 DIAGNOSIS — J209 Acute bronchitis, unspecified: Secondary | ICD-10-CM | POA: Diagnosis not present

## 2021-10-03 DIAGNOSIS — N1831 Chronic kidney disease, stage 3a: Secondary | ICD-10-CM | POA: Diagnosis not present

## 2021-10-03 DIAGNOSIS — F5101 Primary insomnia: Secondary | ICD-10-CM | POA: Diagnosis not present

## 2021-10-03 DIAGNOSIS — R7303 Prediabetes: Secondary | ICD-10-CM | POA: Diagnosis not present

## 2021-10-03 DIAGNOSIS — E7800 Pure hypercholesterolemia, unspecified: Secondary | ICD-10-CM | POA: Diagnosis not present

## 2021-10-03 DIAGNOSIS — E890 Postprocedural hypothyroidism: Secondary | ICD-10-CM | POA: Diagnosis not present

## 2021-10-03 DIAGNOSIS — I700 Atherosclerosis of aorta: Secondary | ICD-10-CM | POA: Diagnosis not present

## 2021-10-03 DIAGNOSIS — Z79899 Other long term (current) drug therapy: Secondary | ICD-10-CM | POA: Diagnosis not present

## 2021-10-03 DIAGNOSIS — I129 Hypertensive chronic kidney disease with stage 1 through stage 4 chronic kidney disease, or unspecified chronic kidney disease: Secondary | ICD-10-CM | POA: Diagnosis not present

## 2021-10-03 DIAGNOSIS — R5383 Other fatigue: Secondary | ICD-10-CM | POA: Diagnosis not present

## 2021-10-03 DIAGNOSIS — I7789 Other specified disorders of arteries and arterioles: Secondary | ICD-10-CM | POA: Diagnosis not present

## 2021-10-09 DIAGNOSIS — D72829 Elevated white blood cell count, unspecified: Secondary | ICD-10-CM | POA: Diagnosis not present

## 2021-10-14 DIAGNOSIS — D0439 Carcinoma in situ of skin of other parts of face: Secondary | ICD-10-CM | POA: Diagnosis not present

## 2021-11-08 DIAGNOSIS — M542 Cervicalgia: Secondary | ICD-10-CM | POA: Diagnosis not present

## 2021-11-11 DIAGNOSIS — M542 Cervicalgia: Secondary | ICD-10-CM | POA: Diagnosis not present

## 2022-01-05 DIAGNOSIS — I129 Hypertensive chronic kidney disease with stage 1 through stage 4 chronic kidney disease, or unspecified chronic kidney disease: Secondary | ICD-10-CM | POA: Diagnosis not present

## 2022-01-05 DIAGNOSIS — Z23 Encounter for immunization: Secondary | ICD-10-CM | POA: Diagnosis not present

## 2022-01-05 DIAGNOSIS — E7800 Pure hypercholesterolemia, unspecified: Secondary | ICD-10-CM | POA: Diagnosis not present

## 2022-01-05 DIAGNOSIS — K219 Gastro-esophageal reflux disease without esophagitis: Secondary | ICD-10-CM | POA: Diagnosis not present

## 2022-01-05 DIAGNOSIS — R7303 Prediabetes: Secondary | ICD-10-CM | POA: Diagnosis not present

## 2022-01-05 DIAGNOSIS — D369 Benign neoplasm, unspecified site: Secondary | ICD-10-CM | POA: Diagnosis not present

## 2022-01-05 DIAGNOSIS — Z0000 Encounter for general adult medical examination without abnormal findings: Secondary | ICD-10-CM | POA: Diagnosis not present

## 2022-01-05 DIAGNOSIS — Z1389 Encounter for screening for other disorder: Secondary | ICD-10-CM | POA: Diagnosis not present

## 2022-01-05 DIAGNOSIS — N1831 Chronic kidney disease, stage 3a: Secondary | ICD-10-CM | POA: Diagnosis not present

## 2022-01-05 DIAGNOSIS — Z79899 Other long term (current) drug therapy: Secondary | ICD-10-CM | POA: Diagnosis not present

## 2022-01-05 DIAGNOSIS — I700 Atherosclerosis of aorta: Secondary | ICD-10-CM | POA: Diagnosis not present

## 2022-01-05 DIAGNOSIS — E890 Postprocedural hypothyroidism: Secondary | ICD-10-CM | POA: Diagnosis not present

## 2022-01-07 ENCOUNTER — Other Ambulatory Visit: Payer: Self-pay | Admitting: Geriatric Medicine

## 2022-01-07 DIAGNOSIS — R5381 Other malaise: Secondary | ICD-10-CM

## 2022-01-12 ENCOUNTER — Other Ambulatory Visit: Payer: Self-pay | Admitting: Geriatric Medicine

## 2022-01-12 DIAGNOSIS — E2839 Other primary ovarian failure: Secondary | ICD-10-CM

## 2022-01-13 DIAGNOSIS — L570 Actinic keratosis: Secondary | ICD-10-CM | POA: Diagnosis not present

## 2022-03-16 DIAGNOSIS — Z961 Presence of intraocular lens: Secondary | ICD-10-CM | POA: Diagnosis not present

## 2022-03-16 DIAGNOSIS — H04123 Dry eye syndrome of bilateral lacrimal glands: Secondary | ICD-10-CM | POA: Diagnosis not present

## 2022-03-16 DIAGNOSIS — H2511 Age-related nuclear cataract, right eye: Secondary | ICD-10-CM | POA: Diagnosis not present

## 2022-03-16 DIAGNOSIS — H26492 Other secondary cataract, left eye: Secondary | ICD-10-CM | POA: Diagnosis not present

## 2022-04-14 DIAGNOSIS — L570 Actinic keratosis: Secondary | ICD-10-CM | POA: Diagnosis not present

## 2022-04-23 DIAGNOSIS — H2511 Age-related nuclear cataract, right eye: Secondary | ICD-10-CM | POA: Diagnosis not present

## 2022-05-19 DIAGNOSIS — L821 Other seborrheic keratosis: Secondary | ICD-10-CM | POA: Diagnosis not present

## 2022-05-20 DIAGNOSIS — M542 Cervicalgia: Secondary | ICD-10-CM | POA: Diagnosis not present

## 2022-05-22 ENCOUNTER — Ambulatory Visit
Admission: RE | Admit: 2022-05-22 | Discharge: 2022-05-22 | Disposition: A | Discharge status: Home / Self Care | Payer: Medicare Other | Source: Ambulatory Visit | Attending: Internal Medicine | Admitting: Internal Medicine

## 2022-05-22 ENCOUNTER — Other Ambulatory Visit: Payer: Self-pay | Admitting: Internal Medicine

## 2022-05-22 DIAGNOSIS — M542 Cervicalgia: Secondary | ICD-10-CM

## 2022-05-22 DIAGNOSIS — I129 Hypertensive chronic kidney disease with stage 1 through stage 4 chronic kidney disease, or unspecified chronic kidney disease: Secondary | ICD-10-CM | POA: Diagnosis not present

## 2022-06-05 DIAGNOSIS — N1832 Chronic kidney disease, stage 3b: Secondary | ICD-10-CM | POA: Diagnosis not present

## 2022-06-05 DIAGNOSIS — I129 Hypertensive chronic kidney disease with stage 1 through stage 4 chronic kidney disease, or unspecified chronic kidney disease: Secondary | ICD-10-CM | POA: Diagnosis not present

## 2022-06-10 ENCOUNTER — Encounter: Payer: Self-pay | Admitting: Orthopaedic Surgery

## 2022-06-10 ENCOUNTER — Ambulatory Visit: Payer: Medicare Other | Admitting: Orthopaedic Surgery

## 2022-06-10 DIAGNOSIS — M542 Cervicalgia: Secondary | ICD-10-CM | POA: Diagnosis not present

## 2022-06-10 NOTE — Progress Notes (Signed)
The patient comes in today for evaluation treatment of neck pain with radicular symptoms going down her right arm.  She had seen her primary care physician and x-rays were obtained showing significant degenerative changes in her neck.  However these were compared to x-rays from 2020 and were unchanged.  She has remote history 30+ years ago of a spinal fusion at C5-C7.  This was a noninstrumented fusion.  She says with Biofreeze and time she is actually feeling a lot better and does not have a lot of symptoms today.  She denies any other acute change in her medical status.  She is active 76 year old female.  She does have some limitations with flexion and extension of her cervical spine given her previous fusion.  She has 5 out of 5 strength today in her right upper extremity and left upper extremity with no radicular symptoms.  I did review the plain films of her cervical spine with her.  She has a C5-C7 fusion.  There is arthritic changes above and below the fusion and in the posterior elements.  Since she is doing well right now, no further treatment is needed.  If she does have a flareup of her symptoms we could send her to physical therapy and even obtain an MRI of the cervical spine at this point to assess for any nerve compression if she does develop radicular symptoms again.  She knows to let us know if that is the case.  All questions and concerns were answered and addressed.

## 2022-07-16 IMAGING — CR DG FOOT COMPLETE 3+V*L*
3 series · 3 of 3 positions shown · non-contrast
Comparison: None.

CLINICAL DATA: Left foot pain.

EXAM:
LEFT FOOT - COMPLETE 3+ VIEW

[t foot ap left]
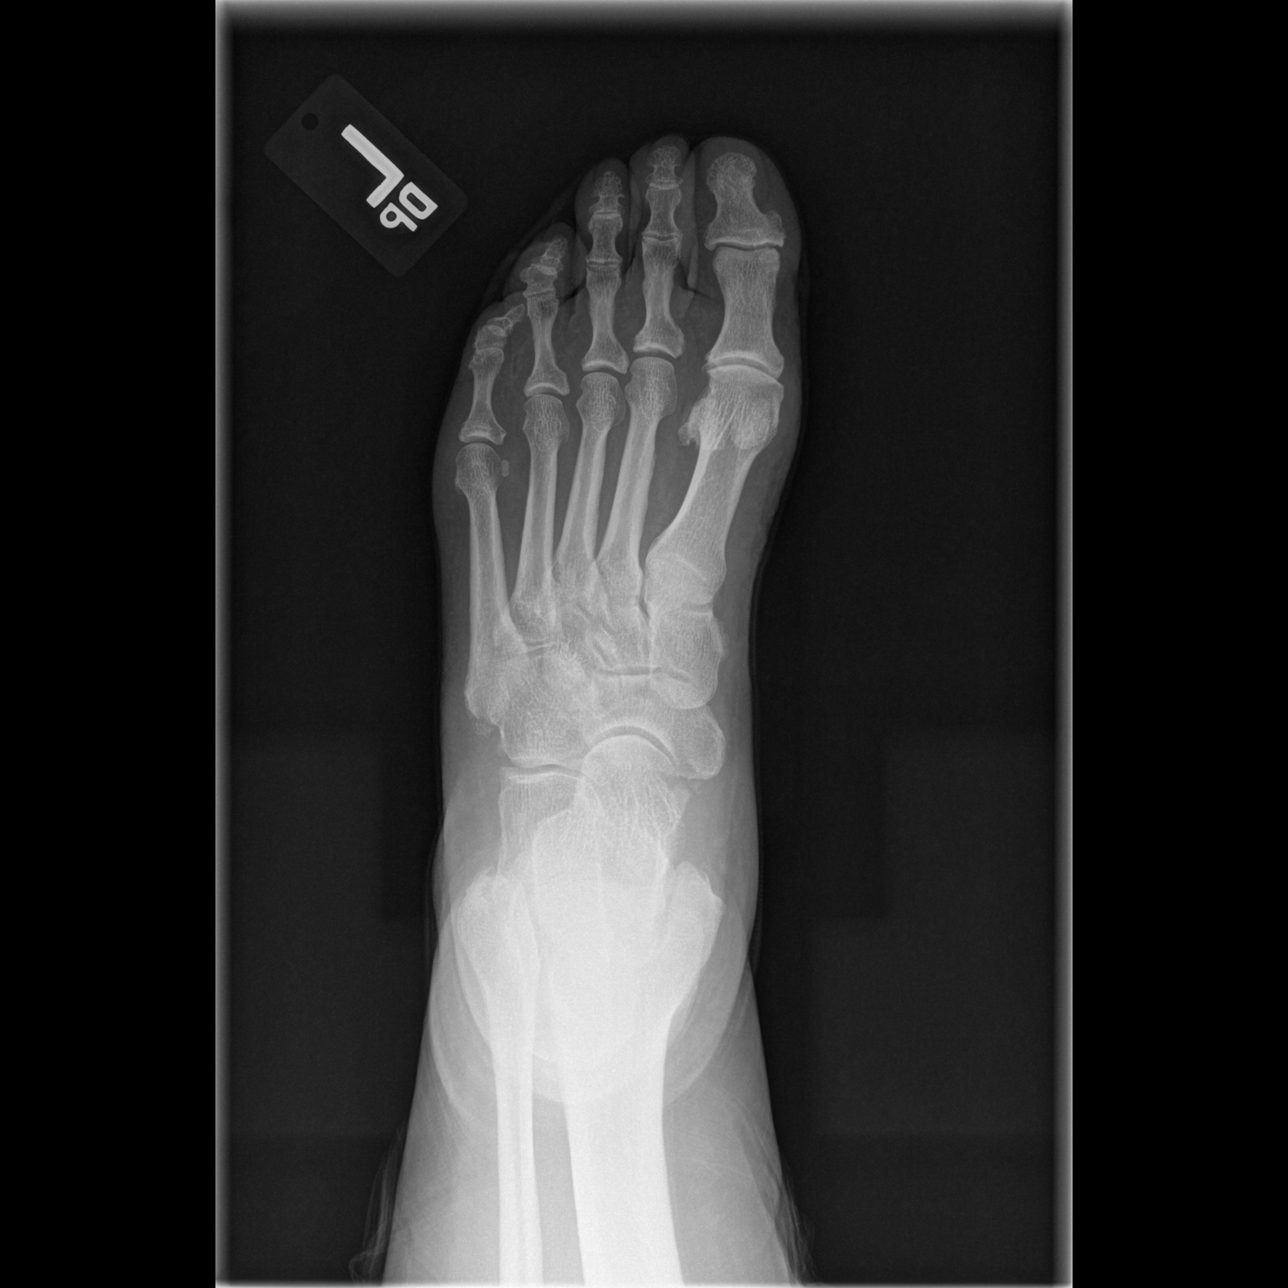

[t foot oblique left]
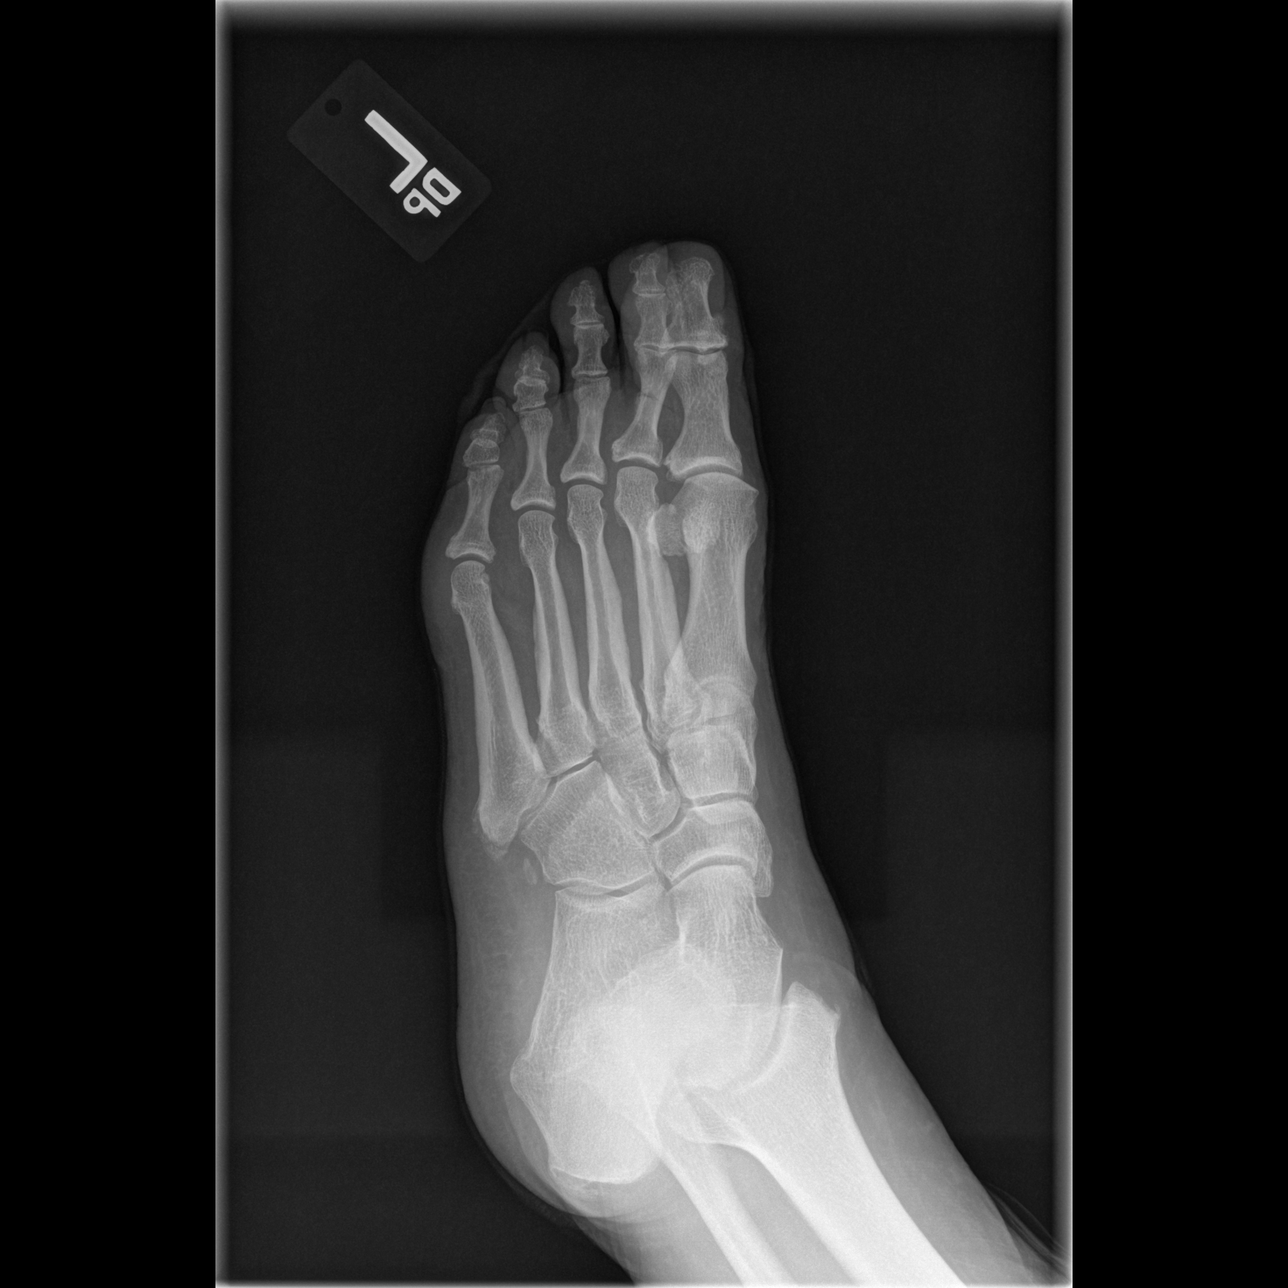

[t foot lat left]
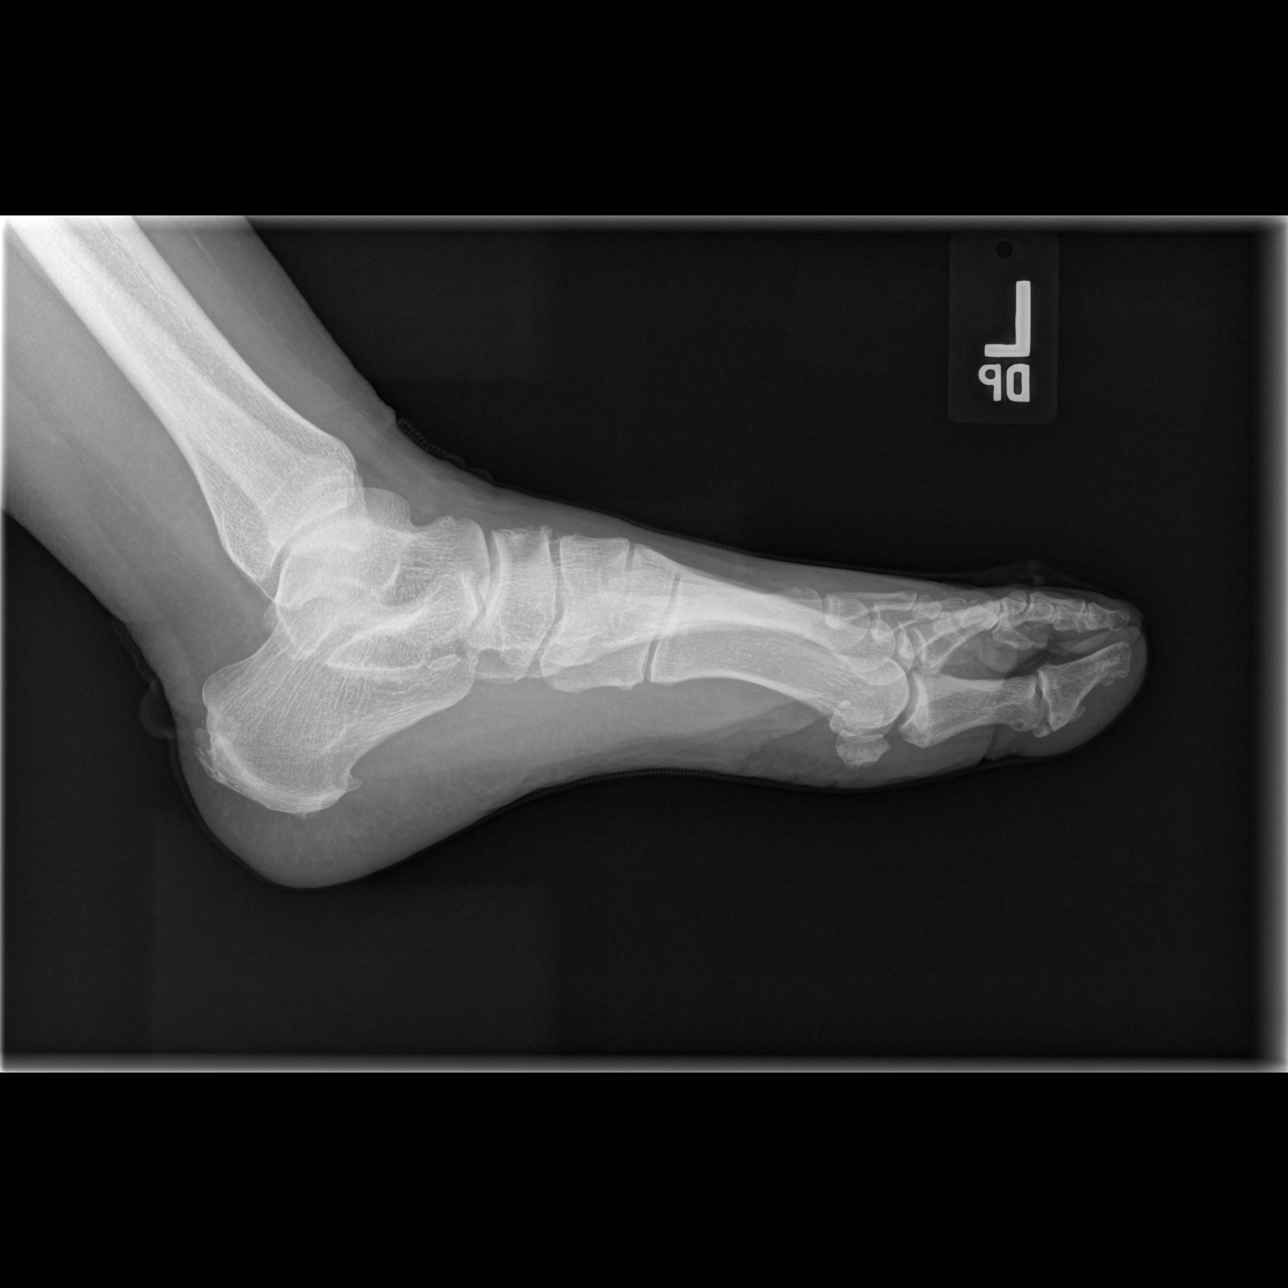

[3 of 3 positions shown; findings below may reference images not displayed]

FINDINGS: Mild forefoot and midfoot degenerative changes but no acute bony
abnormality or destructive bony changes. Insertional calcific
tendinopathy noted at the base of the fifth metatarsal. There is
also a small calcaneal heel spur and mild insertional calcifications
involving the distal Achilles tendon.
IMPRESSION: Mild degenerative changes but no acute bony findings.

## 2022-07-16 IMAGING — CR DG LUMBAR SPINE COMPLETE 4+V
5 series · 5 of 5 positions shown · non-contrast
Comparison: 01/12/2018

CLINICAL DATA: Low back pain.

EXAM:
LUMBAR SPINE - COMPLETE 4+ VIEW

[t l-spine a.p.]
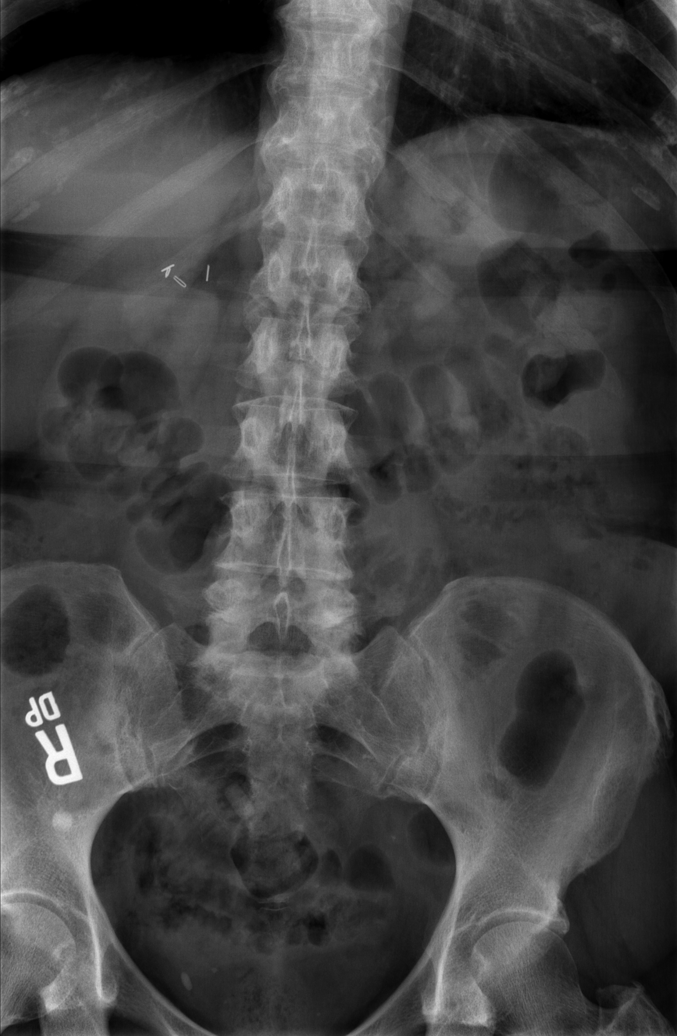

[t l-spine oblique exposure (1 of 2)]
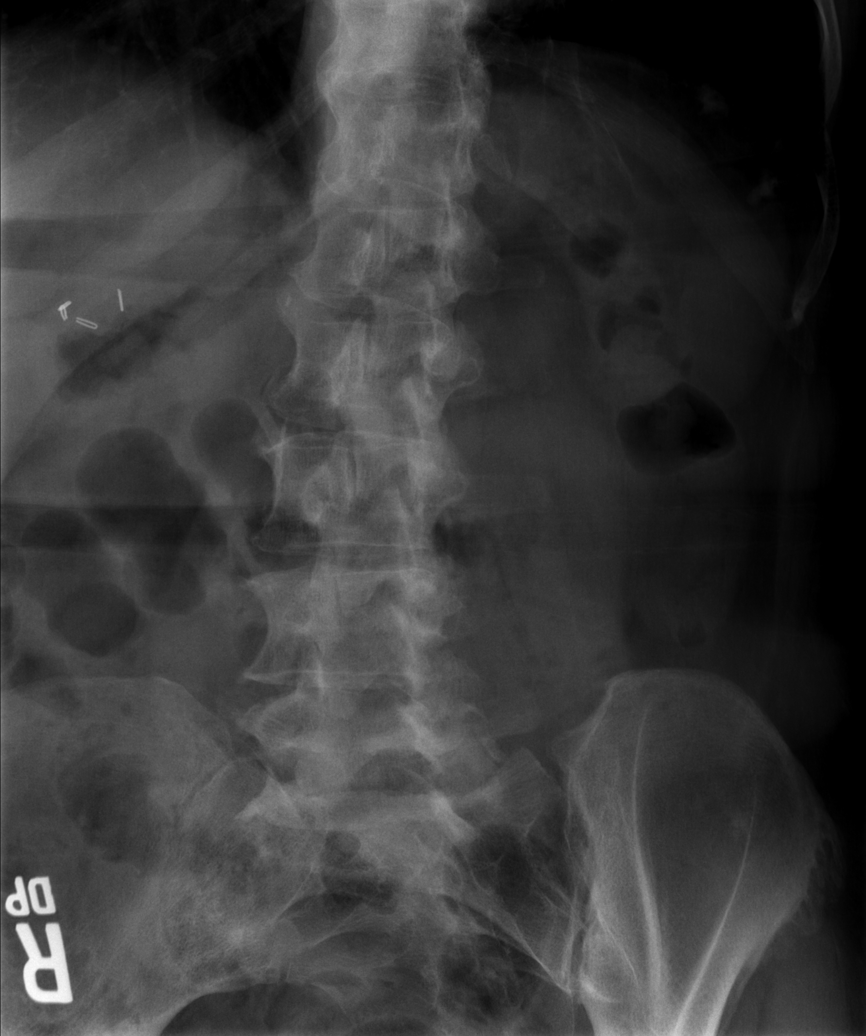

[t l-spine oblique exposure (2 of 2)]
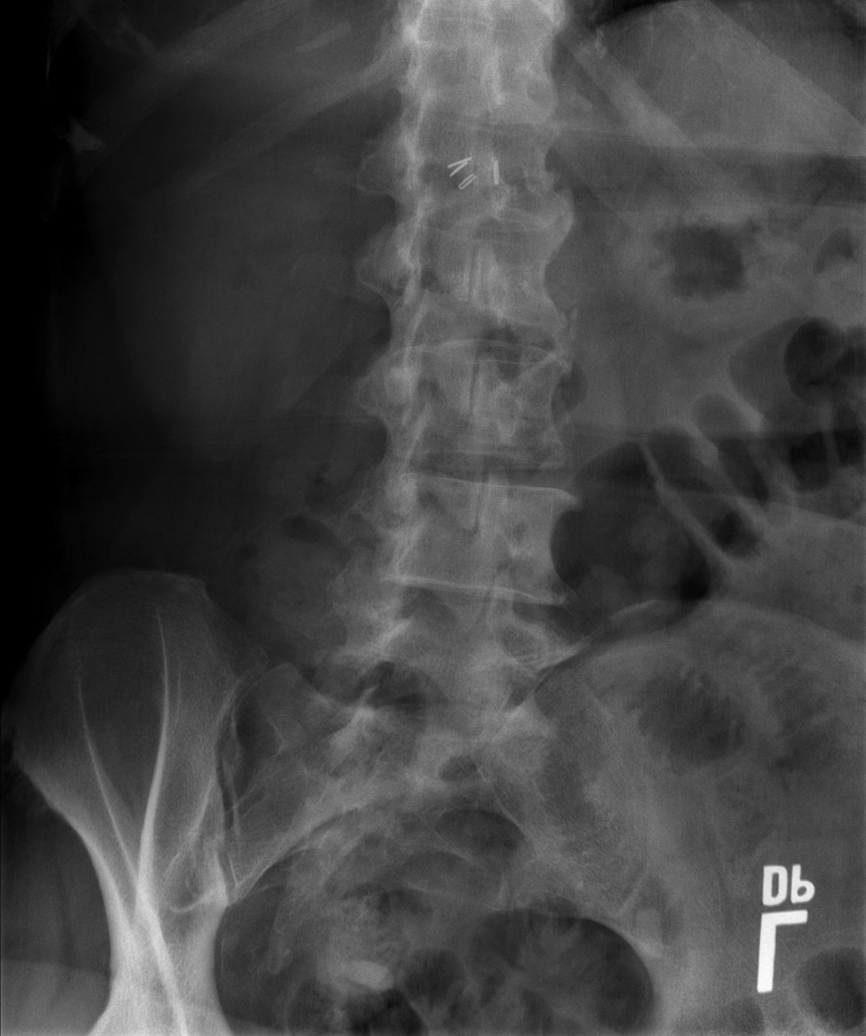

[t l-spine lat]
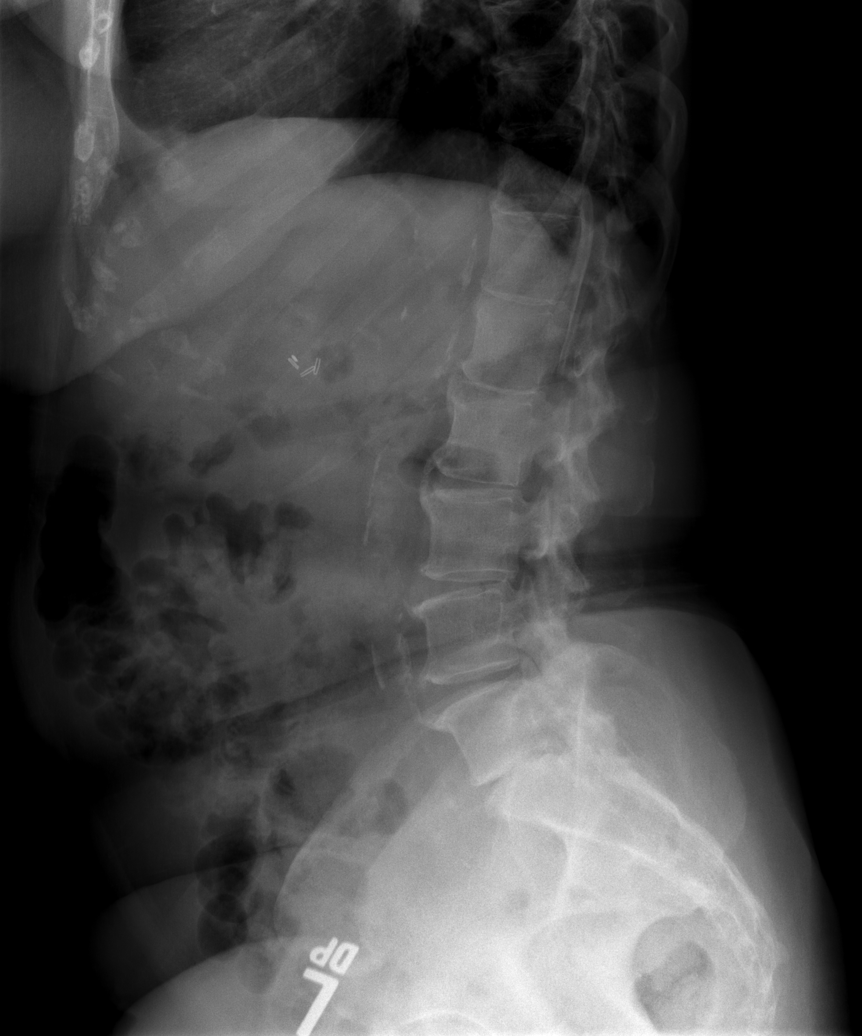

[t l-spine l5-s1 spot]
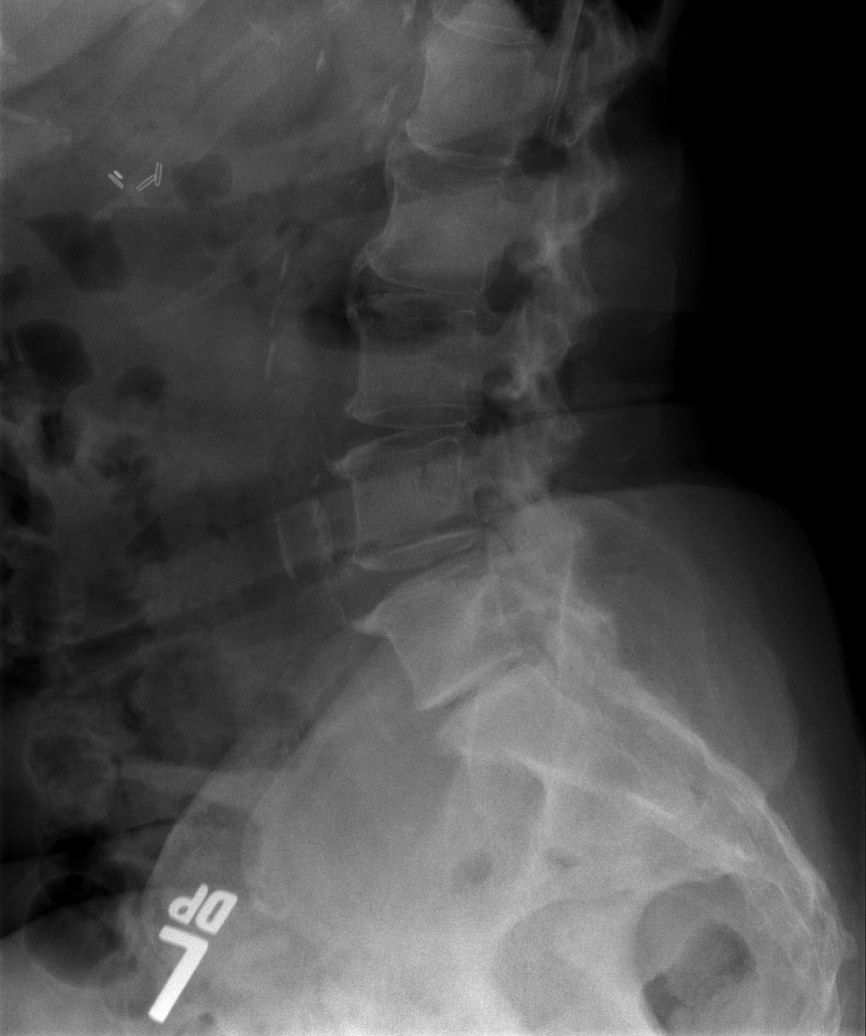

[5 of 5 positions shown; findings below may reference images not displayed]

FINDINGS: Stable mild degenerative lumbar spondylosis with multilevel disc
disease and facet disease. The alignment is normal. No acute bony
findings or destructive bony changes. No pars defects.

The visualized bony pelvis is intact. The SI joints are
unremarkable.

Stable aortic and iliac artery vascular calcifications without
definite aneurysm.
IMPRESSION: Mild degenerative lumbar spondylosis with multilevel disc disease
and facet disease but no acute bony findings.

## 2022-07-20 ENCOUNTER — Inpatient Hospital Stay: Admission: RE | Admit: 2022-07-20 | Payer: Medicare Other | Source: Ambulatory Visit

## 2022-08-25 DIAGNOSIS — R69 Illness, unspecified: Secondary | ICD-10-CM | POA: Diagnosis not present

## 2022-09-14 DIAGNOSIS — R058 Other specified cough: Secondary | ICD-10-CM | POA: Diagnosis not present

## 2022-09-14 DIAGNOSIS — R5383 Other fatigue: Secondary | ICD-10-CM | POA: Diagnosis not present

## 2022-10-10 DIAGNOSIS — R300 Dysuria: Secondary | ICD-10-CM | POA: Diagnosis not present

## 2022-11-04 DIAGNOSIS — L820 Inflamed seborrheic keratosis: Secondary | ICD-10-CM | POA: Diagnosis not present

## 2022-11-20 DIAGNOSIS — Z85528 Personal history of other malignant neoplasm of kidney: Secondary | ICD-10-CM | POA: Diagnosis not present

## 2022-11-20 DIAGNOSIS — M545 Low back pain, unspecified: Secondary | ICD-10-CM | POA: Diagnosis not present

## 2022-11-20 DIAGNOSIS — R300 Dysuria: Secondary | ICD-10-CM | POA: Diagnosis not present

## 2022-11-23 DIAGNOSIS — M545 Low back pain, unspecified: Secondary | ICD-10-CM | POA: Diagnosis not present

## 2022-11-23 DIAGNOSIS — M7062 Trochanteric bursitis, left hip: Secondary | ICD-10-CM | POA: Diagnosis not present

## 2023-01-12 DIAGNOSIS — K219 Gastro-esophageal reflux disease without esophagitis: Secondary | ICD-10-CM | POA: Diagnosis not present

## 2023-01-12 DIAGNOSIS — I129 Hypertensive chronic kidney disease with stage 1 through stage 4 chronic kidney disease, or unspecified chronic kidney disease: Secondary | ICD-10-CM | POA: Diagnosis not present

## 2023-01-12 DIAGNOSIS — E7800 Pure hypercholesterolemia, unspecified: Secondary | ICD-10-CM | POA: Diagnosis not present

## 2023-01-12 DIAGNOSIS — I700 Atherosclerosis of aorta: Secondary | ICD-10-CM | POA: Diagnosis not present

## 2023-01-12 DIAGNOSIS — N1832 Chronic kidney disease, stage 3b: Secondary | ICD-10-CM | POA: Diagnosis not present

## 2023-01-12 DIAGNOSIS — F5101 Primary insomnia: Secondary | ICD-10-CM | POA: Diagnosis not present

## 2023-01-12 DIAGNOSIS — R7309 Other abnormal glucose: Secondary | ICD-10-CM | POA: Diagnosis not present

## 2023-01-12 DIAGNOSIS — G451 Carotid artery syndrome (hemispheric): Secondary | ICD-10-CM | POA: Diagnosis not present

## 2023-01-12 DIAGNOSIS — Z0000 Encounter for general adult medical examination without abnormal findings: Secondary | ICD-10-CM | POA: Diagnosis not present

## 2023-01-12 DIAGNOSIS — R7303 Prediabetes: Secondary | ICD-10-CM | POA: Diagnosis not present

## 2023-01-12 DIAGNOSIS — E890 Postprocedural hypothyroidism: Secondary | ICD-10-CM | POA: Diagnosis not present

## 2023-02-09 DIAGNOSIS — I5032 Chronic diastolic (congestive) heart failure: Secondary | ICD-10-CM | POA: Diagnosis not present

## 2023-02-09 DIAGNOSIS — M5442 Lumbago with sciatica, left side: Secondary | ICD-10-CM | POA: Diagnosis not present

## 2023-02-09 DIAGNOSIS — E7800 Pure hypercholesterolemia, unspecified: Secondary | ICD-10-CM | POA: Diagnosis not present

## 2023-02-09 DIAGNOSIS — E890 Postprocedural hypothyroidism: Secondary | ICD-10-CM | POA: Diagnosis not present

## 2023-03-02 DIAGNOSIS — M5126 Other intervertebral disc displacement, lumbar region: Secondary | ICD-10-CM | POA: Diagnosis not present

## 2023-03-17 DIAGNOSIS — M5127 Other intervertebral disc displacement, lumbosacral region: Secondary | ICD-10-CM | POA: Diagnosis not present

## 2023-03-17 DIAGNOSIS — M4807 Spinal stenosis, lumbosacral region: Secondary | ICD-10-CM | POA: Diagnosis not present

## 2023-03-17 DIAGNOSIS — M48061 Spinal stenosis, lumbar region without neurogenic claudication: Secondary | ICD-10-CM | POA: Diagnosis not present

## 2023-03-17 DIAGNOSIS — M5136 Other intervertebral disc degeneration, lumbar region: Secondary | ICD-10-CM | POA: Diagnosis not present

## 2023-04-05 DIAGNOSIS — M5127 Other intervertebral disc displacement, lumbosacral region: Secondary | ICD-10-CM | POA: Diagnosis not present

## 2023-04-13 DIAGNOSIS — M5127 Other intervertebral disc displacement, lumbosacral region: Secondary | ICD-10-CM | POA: Diagnosis not present

## 2023-04-13 DIAGNOSIS — M5126 Other intervertebral disc displacement, lumbar region: Secondary | ICD-10-CM | POA: Diagnosis not present

## 2023-06-02 DIAGNOSIS — R29706 NIHSS score 6: Secondary | ICD-10-CM | POA: Diagnosis not present

## 2023-06-02 DIAGNOSIS — I10 Essential (primary) hypertension: Secondary | ICD-10-CM | POA: Diagnosis not present

## 2023-06-02 DIAGNOSIS — R1310 Dysphagia, unspecified: Secondary | ICD-10-CM | POA: Diagnosis not present

## 2023-06-02 DIAGNOSIS — Z66 Do not resuscitate: Secondary | ICD-10-CM | POA: Diagnosis not present

## 2023-06-02 DIAGNOSIS — R059 Cough, unspecified: Secondary | ICD-10-CM | POA: Diagnosis not present

## 2023-06-02 DIAGNOSIS — E039 Hypothyroidism, unspecified: Secondary | ICD-10-CM | POA: Diagnosis not present

## 2023-06-02 DIAGNOSIS — I6389 Other cerebral infarction: Secondary | ICD-10-CM | POA: Diagnosis not present

## 2023-06-02 DIAGNOSIS — E8770 Fluid overload, unspecified: Secondary | ICD-10-CM | POA: Diagnosis not present

## 2023-06-02 DIAGNOSIS — R9082 White matter disease, unspecified: Secondary | ICD-10-CM | POA: Diagnosis not present

## 2023-06-02 DIAGNOSIS — I6521 Occlusion and stenosis of right carotid artery: Secondary | ICD-10-CM | POA: Diagnosis not present

## 2023-06-02 DIAGNOSIS — R7309 Other abnormal glucose: Secondary | ICD-10-CM | POA: Diagnosis not present

## 2023-06-02 DIAGNOSIS — E041 Nontoxic single thyroid nodule: Secondary | ICD-10-CM | POA: Diagnosis not present

## 2023-06-02 DIAGNOSIS — E038 Other specified hypothyroidism: Secondary | ICD-10-CM | POA: Diagnosis not present

## 2023-06-02 DIAGNOSIS — R29705 NIHSS score 5: Secondary | ICD-10-CM | POA: Diagnosis not present

## 2023-06-02 DIAGNOSIS — I639 Cerebral infarction, unspecified: Secondary | ICD-10-CM | POA: Diagnosis not present

## 2023-06-02 DIAGNOSIS — I340 Nonrheumatic mitral (valve) insufficiency: Secondary | ICD-10-CM | POA: Diagnosis not present

## 2023-06-02 DIAGNOSIS — K219 Gastro-esophageal reflux disease without esophagitis: Secondary | ICD-10-CM | POA: Diagnosis not present

## 2023-06-02 DIAGNOSIS — I6782 Cerebral ischemia: Secondary | ICD-10-CM | POA: Diagnosis not present

## 2023-06-02 DIAGNOSIS — I672 Cerebral atherosclerosis: Secondary | ICD-10-CM | POA: Diagnosis not present

## 2023-06-02 DIAGNOSIS — I63512 Cerebral infarction due to unspecified occlusion or stenosis of left middle cerebral artery: Secondary | ICD-10-CM | POA: Diagnosis not present

## 2023-06-02 DIAGNOSIS — E785 Hyperlipidemia, unspecified: Secondary | ICD-10-CM | POA: Diagnosis not present

## 2023-06-02 DIAGNOSIS — R531 Weakness: Secondary | ICD-10-CM | POA: Diagnosis not present

## 2023-06-02 DIAGNOSIS — Z7982 Long term (current) use of aspirin: Secondary | ICD-10-CM | POA: Diagnosis not present

## 2023-06-02 DIAGNOSIS — I700 Atherosclerosis of aorta: Secondary | ICD-10-CM | POA: Diagnosis not present

## 2023-06-07 DIAGNOSIS — I69320 Aphasia following cerebral infarction: Secondary | ICD-10-CM | POA: Diagnosis not present

## 2023-06-07 DIAGNOSIS — I69393 Ataxia following cerebral infarction: Secondary | ICD-10-CM | POA: Diagnosis not present

## 2023-06-07 DIAGNOSIS — I69392 Facial weakness following cerebral infarction: Secondary | ICD-10-CM | POA: Diagnosis not present

## 2023-06-07 DIAGNOSIS — I69391 Dysphagia following cerebral infarction: Secondary | ICD-10-CM | POA: Diagnosis not present

## 2023-06-07 DIAGNOSIS — E038 Other specified hypothyroidism: Secondary | ICD-10-CM | POA: Diagnosis not present

## 2023-06-07 DIAGNOSIS — I69351 Hemiplegia and hemiparesis following cerebral infarction affecting right dominant side: Secondary | ICD-10-CM | POA: Diagnosis not present

## 2023-06-07 DIAGNOSIS — R531 Weakness: Secondary | ICD-10-CM | POA: Diagnosis not present

## 2023-06-07 DIAGNOSIS — I69354 Hemiplegia and hemiparesis following cerebral infarction affecting left non-dominant side: Secondary | ICD-10-CM | POA: Diagnosis not present

## 2023-06-07 DIAGNOSIS — E039 Hypothyroidism, unspecified: Secondary | ICD-10-CM | POA: Diagnosis not present

## 2023-06-07 DIAGNOSIS — R1310 Dysphagia, unspecified: Secondary | ICD-10-CM | POA: Diagnosis not present

## 2023-06-07 DIAGNOSIS — I10 Essential (primary) hypertension: Secondary | ICD-10-CM | POA: Diagnosis not present

## 2023-06-07 DIAGNOSIS — I69322 Dysarthria following cerebral infarction: Secondary | ICD-10-CM | POA: Diagnosis not present

## 2023-06-07 DIAGNOSIS — R29898 Other symptoms and signs involving the musculoskeletal system: Secondary | ICD-10-CM | POA: Diagnosis not present

## 2023-06-07 DIAGNOSIS — E860 Dehydration: Secondary | ICD-10-CM | POA: Diagnosis not present

## 2023-06-07 DIAGNOSIS — I69319 Unspecified symptoms and signs involving cognitive functions following cerebral infarction: Secondary | ICD-10-CM | POA: Diagnosis not present

## 2023-06-07 DIAGNOSIS — R059 Cough, unspecified: Secondary | ICD-10-CM | POA: Diagnosis not present

## 2023-06-07 DIAGNOSIS — E785 Hyperlipidemia, unspecified: Secondary | ICD-10-CM | POA: Diagnosis not present

## 2023-06-07 DIAGNOSIS — I639 Cerebral infarction, unspecified: Secondary | ICD-10-CM | POA: Diagnosis not present

## 2023-06-07 DIAGNOSIS — I340 Nonrheumatic mitral (valve) insufficiency: Secondary | ICD-10-CM | POA: Diagnosis not present

## 2023-06-07 DIAGNOSIS — R1311 Dysphagia, oral phase: Secondary | ICD-10-CM | POA: Diagnosis not present

## 2023-06-07 DIAGNOSIS — E7800 Pure hypercholesterolemia, unspecified: Secondary | ICD-10-CM | POA: Diagnosis not present

## 2023-06-08 DIAGNOSIS — I69354 Hemiplegia and hemiparesis following cerebral infarction affecting left non-dominant side: Secondary | ICD-10-CM | POA: Diagnosis not present

## 2023-06-08 DIAGNOSIS — I69319 Unspecified symptoms and signs involving cognitive functions following cerebral infarction: Secondary | ICD-10-CM | POA: Diagnosis not present

## 2023-06-08 DIAGNOSIS — I69393 Ataxia following cerebral infarction: Secondary | ICD-10-CM | POA: Diagnosis not present

## 2023-06-09 DIAGNOSIS — I639 Cerebral infarction, unspecified: Secondary | ICD-10-CM | POA: Diagnosis not present

## 2023-06-10 DIAGNOSIS — I69393 Ataxia following cerebral infarction: Secondary | ICD-10-CM | POA: Diagnosis not present

## 2023-06-10 DIAGNOSIS — I69354 Hemiplegia and hemiparesis following cerebral infarction affecting left non-dominant side: Secondary | ICD-10-CM | POA: Diagnosis not present

## 2023-06-10 DIAGNOSIS — I69319 Unspecified symptoms and signs involving cognitive functions following cerebral infarction: Secondary | ICD-10-CM | POA: Diagnosis not present

## 2023-06-11 DIAGNOSIS — I69354 Hemiplegia and hemiparesis following cerebral infarction affecting left non-dominant side: Secondary | ICD-10-CM | POA: Diagnosis not present

## 2023-06-11 DIAGNOSIS — I69393 Ataxia following cerebral infarction: Secondary | ICD-10-CM | POA: Diagnosis not present

## 2023-06-11 DIAGNOSIS — I69319 Unspecified symptoms and signs involving cognitive functions following cerebral infarction: Secondary | ICD-10-CM | POA: Diagnosis not present

## 2023-06-14 DIAGNOSIS — I639 Cerebral infarction, unspecified: Secondary | ICD-10-CM | POA: Diagnosis not present

## 2023-06-15 DIAGNOSIS — I639 Cerebral infarction, unspecified: Secondary | ICD-10-CM | POA: Diagnosis not present

## 2023-06-16 DIAGNOSIS — I639 Cerebral infarction, unspecified: Secondary | ICD-10-CM | POA: Diagnosis not present

## 2023-06-17 DIAGNOSIS — I639 Cerebral infarction, unspecified: Secondary | ICD-10-CM | POA: Diagnosis not present

## 2023-06-18 DIAGNOSIS — I639 Cerebral infarction, unspecified: Secondary | ICD-10-CM | POA: Diagnosis not present

## 2023-06-21 DIAGNOSIS — I639 Cerebral infarction, unspecified: Secondary | ICD-10-CM | POA: Diagnosis not present

## 2023-06-24 DIAGNOSIS — R1311 Dysphagia, oral phase: Secondary | ICD-10-CM | POA: Diagnosis not present

## 2023-06-25 DIAGNOSIS — I10 Essential (primary) hypertension: Secondary | ICD-10-CM | POA: Diagnosis not present

## 2023-06-25 DIAGNOSIS — I639 Cerebral infarction, unspecified: Secondary | ICD-10-CM | POA: Diagnosis not present

## 2023-06-25 DIAGNOSIS — I493 Ventricular premature depolarization: Secondary | ICD-10-CM | POA: Diagnosis not present

## 2023-06-25 DIAGNOSIS — E785 Hyperlipidemia, unspecified: Secondary | ICD-10-CM | POA: Diagnosis not present

## 2023-06-25 DIAGNOSIS — Z8673 Personal history of transient ischemic attack (TIA), and cerebral infarction without residual deficits: Secondary | ICD-10-CM | POA: Diagnosis not present

## 2023-07-13 DIAGNOSIS — M7989 Other specified soft tissue disorders: Secondary | ICD-10-CM | POA: Diagnosis not present

## 2023-07-13 DIAGNOSIS — I5032 Chronic diastolic (congestive) heart failure: Secondary | ICD-10-CM | POA: Diagnosis not present

## 2023-07-13 DIAGNOSIS — Z23 Encounter for immunization: Secondary | ICD-10-CM | POA: Diagnosis not present

## 2023-07-13 DIAGNOSIS — N1832 Chronic kidney disease, stage 3b: Secondary | ICD-10-CM | POA: Diagnosis not present

## 2023-07-19 DIAGNOSIS — Z789 Other specified health status: Secondary | ICD-10-CM | POA: Diagnosis not present

## 2023-07-19 DIAGNOSIS — Z7409 Other reduced mobility: Secondary | ICD-10-CM | POA: Diagnosis not present

## 2023-07-19 DIAGNOSIS — Z8673 Personal history of transient ischemic attack (TIA), and cerebral infarction without residual deficits: Secondary | ICD-10-CM | POA: Diagnosis not present

## 2023-07-19 DIAGNOSIS — Z09 Encounter for follow-up examination after completed treatment for conditions other than malignant neoplasm: Secondary | ICD-10-CM | POA: Diagnosis not present

## 2023-07-19 DIAGNOSIS — R1311 Dysphagia, oral phase: Secondary | ICD-10-CM | POA: Diagnosis not present

## 2023-07-27 DIAGNOSIS — Z8673 Personal history of transient ischemic attack (TIA), and cerebral infarction without residual deficits: Secondary | ICD-10-CM | POA: Diagnosis not present

## 2023-07-27 DIAGNOSIS — E785 Hyperlipidemia, unspecified: Secondary | ICD-10-CM | POA: Diagnosis not present

## 2023-07-27 DIAGNOSIS — I10 Essential (primary) hypertension: Secondary | ICD-10-CM | POA: Diagnosis not present

## 2023-08-09 DIAGNOSIS — Z85528 Personal history of other malignant neoplasm of kidney: Secondary | ICD-10-CM | POA: Diagnosis not present

## 2023-08-09 DIAGNOSIS — R3915 Urgency of urination: Secondary | ICD-10-CM | POA: Diagnosis not present

## 2023-08-27 DIAGNOSIS — M47812 Spondylosis without myelopathy or radiculopathy, cervical region: Secondary | ICD-10-CM | POA: Diagnosis not present

## 2023-08-27 DIAGNOSIS — S161XXA Strain of muscle, fascia and tendon at neck level, initial encounter: Secondary | ICD-10-CM | POA: Diagnosis not present

## 2023-08-27 DIAGNOSIS — Z8673 Personal history of transient ischemic attack (TIA), and cerebral infarction without residual deficits: Secondary | ICD-10-CM | POA: Diagnosis not present

## 2023-08-27 DIAGNOSIS — Z87891 Personal history of nicotine dependence: Secondary | ICD-10-CM | POA: Diagnosis not present

## 2023-08-27 DIAGNOSIS — M51369 Other intervertebral disc degeneration, lumbar region without mention of lumbar back pain or lower extremity pain: Secondary | ICD-10-CM | POA: Diagnosis not present

## 2023-08-27 DIAGNOSIS — Y999 Unspecified external cause status: Secondary | ICD-10-CM | POA: Diagnosis not present

## 2023-08-27 DIAGNOSIS — M5030 Other cervical disc degeneration, unspecified cervical region: Secondary | ICD-10-CM | POA: Diagnosis not present

## 2023-09-07 DIAGNOSIS — M4722 Other spondylosis with radiculopathy, cervical region: Secondary | ICD-10-CM | POA: Diagnosis not present

## 2023-09-07 DIAGNOSIS — M542 Cervicalgia: Secondary | ICD-10-CM | POA: Diagnosis not present

## 2023-10-07 DIAGNOSIS — M47812 Spondylosis without myelopathy or radiculopathy, cervical region: Secondary | ICD-10-CM | POA: Diagnosis not present

## 2023-10-07 DIAGNOSIS — M4312 Spondylolisthesis, cervical region: Secondary | ICD-10-CM | POA: Diagnosis not present

## 2023-10-07 DIAGNOSIS — M4802 Spinal stenosis, cervical region: Secondary | ICD-10-CM | POA: Diagnosis not present

## 2023-10-07 DIAGNOSIS — M5023 Other cervical disc displacement, cervicothoracic region: Secondary | ICD-10-CM | POA: Diagnosis not present

## 2023-10-07 DIAGNOSIS — M542 Cervicalgia: Secondary | ICD-10-CM | POA: Diagnosis not present

## 2023-10-12 DIAGNOSIS — M4722 Other spondylosis with radiculopathy, cervical region: Secondary | ICD-10-CM | POA: Diagnosis not present

## 2023-10-29 DIAGNOSIS — R600 Localized edema: Secondary | ICD-10-CM | POA: Diagnosis not present

## 2023-10-29 DIAGNOSIS — I5032 Chronic diastolic (congestive) heart failure: Secondary | ICD-10-CM | POA: Diagnosis not present

## 2023-10-29 DIAGNOSIS — I129 Hypertensive chronic kidney disease with stage 1 through stage 4 chronic kidney disease, or unspecified chronic kidney disease: Secondary | ICD-10-CM | POA: Diagnosis not present

## 2023-10-29 DIAGNOSIS — N1832 Chronic kidney disease, stage 3b: Secondary | ICD-10-CM | POA: Diagnosis not present

## 2023-11-02 DIAGNOSIS — R5383 Other fatigue: Secondary | ICD-10-CM | POA: Diagnosis not present

## 2023-11-02 DIAGNOSIS — R509 Fever, unspecified: Secondary | ICD-10-CM | POA: Diagnosis not present

## 2023-11-11 ENCOUNTER — Ambulatory Visit: Payer: Medicare Other | Admitting: Physician Assistant

## 2023-11-11 ENCOUNTER — Other Ambulatory Visit: Payer: Self-pay

## 2023-11-11 ENCOUNTER — Encounter: Payer: Self-pay | Admitting: Physician Assistant

## 2023-11-11 DIAGNOSIS — M25551 Pain in right hip: Secondary | ICD-10-CM

## 2023-11-11 MED ORDER — METHYLPREDNISOLONE ACETATE 40 MG/ML IJ SUSP
40.0000 mg | INTRAMUSCULAR | Status: AC | PRN
Start: 2023-11-11 — End: 2023-11-11
  Administered 2023-11-11: 40 mg via INTRA_ARTICULAR

## 2023-11-11 MED ORDER — LIDOCAINE HCL 1 % IJ SOLN
3.0000 mL | INTRAMUSCULAR | Status: AC | PRN
  Administered 2023-11-11: 3 mL

## 2023-11-11 NOTE — Progress Notes (Signed)
HPI: [name] comes in today with right hip pain.  Pain lateral aspect of her hip denies any numbness tingling down the leg.  Denies any groin pain.  No known injury.  She has tenderness lateral aspect of both hips with right is more bothersome than the left.  She has pain if she lies over on the right hip at night.  She reports that she has had a stroke since she last saw [place] but no fevers chills or ongoing infection.  Review of systems: See HPI otherwise negative  Physical exam: General Well-developed well-nourished female in no acute distress able to get on and off the exam table on her own. Psych: Alert and oriented x 3 Bilateral hips good range of motion of both hips without pain.  Tenderness over the the trochanteric region both hips right greater than left.  Radiographs: AP pelvis lateral view right hip: Bilateral hips well located.  Hip joints are well-preserved bilaterally.  No acute fractures acute findings no bony lesions or bony abnormalities either hip.   Impression: Right hip trochanteric bursitis  Plan: She is shown IT band stretching exercises had her demonstrate these back.  Offered right hip trochanteric injection she was agreeable.  She tolerated the injection well today.  She will follow-up with [place] as needed or if pain continues.      Procedure Note  Patient: Julia Perkins             Date of Birth: 05-08-1946           MRN: [id number]             Visit Date: 11/11/2023  Procedures: Visit Diagnoses:  1. Pain in right hip     Large Joint Inj: R greater trochanter on 11/11/2023 4:20 PM Indications: pain Details: 22 G 1.5 in needle, lateral approach  Arthrogram: No  Medications: 3 mL lidocaine 1 %; 40 mg methylPREDNISolone acetate 40 MG/ML Outcome: tolerated well, no immediate complications Procedure, treatment alternatives, risks and benefits explained, specific risks discussed. Consent was given by the patient. Immediately prior to procedure a time out was  called to verify the correct patient, procedure, equipment, support staff and site/side marked as required. Patient was prepped and draped in the usual sterile fashion.

## 2023-11-12 DIAGNOSIS — N1832 Chronic kidney disease, stage 3b: Secondary | ICD-10-CM | POA: Diagnosis not present

## 2023-11-12 DIAGNOSIS — I5032 Chronic diastolic (congestive) heart failure: Secondary | ICD-10-CM | POA: Diagnosis not present

## 2023-11-12 DIAGNOSIS — R600 Localized edema: Secondary | ICD-10-CM | POA: Diagnosis not present

## 2023-11-12 DIAGNOSIS — Z905 Acquired absence of kidney: Secondary | ICD-10-CM | POA: Diagnosis not present

## 2023-11-12 DIAGNOSIS — I129 Hypertensive chronic kidney disease with stage 1 through stage 4 chronic kidney disease, or unspecified chronic kidney disease: Secondary | ICD-10-CM | POA: Diagnosis not present

## 2023-11-17 ENCOUNTER — Telehealth: Payer: Self-pay | Admitting: Physician Assistant

## 2023-11-17 NOTE — Telephone Encounter (Signed)
 Pt called requesting a call from Dr Magnus Ivan. Pt states she is still having lots of pain after injections. Please call pt at [phone].

## 2023-12-23 DIAGNOSIS — N1832 Chronic kidney disease, stage 3b: Secondary | ICD-10-CM | POA: Diagnosis not present

## 2023-12-23 DIAGNOSIS — R600 Localized edema: Secondary | ICD-10-CM | POA: Diagnosis not present

## 2023-12-23 DIAGNOSIS — I69398 Other sequelae of cerebral infarction: Secondary | ICD-10-CM | POA: Diagnosis not present

## 2023-12-23 DIAGNOSIS — Z905 Acquired absence of kidney: Secondary | ICD-10-CM | POA: Diagnosis not present

## 2023-12-23 DIAGNOSIS — I5032 Chronic diastolic (congestive) heart failure: Secondary | ICD-10-CM | POA: Diagnosis not present

## 2023-12-23 DIAGNOSIS — I129 Hypertensive chronic kidney disease with stage 1 through stage 4 chronic kidney disease, or unspecified chronic kidney disease: Secondary | ICD-10-CM | POA: Diagnosis not present

## 2023-12-23 DIAGNOSIS — Z8673 Personal history of transient ischemic attack (TIA), and cerebral infarction without residual deficits: Secondary | ICD-10-CM | POA: Diagnosis not present

## 2024-01-10 ENCOUNTER — Encounter: Payer: Self-pay | Admitting: Orthopaedic Surgery

## 2024-01-10 ENCOUNTER — Ambulatory Visit: Admitting: Orthopaedic Surgery

## 2024-01-10 DIAGNOSIS — M25551 Pain in right hip: Secondary | ICD-10-CM

## 2024-01-10 NOTE — Progress Notes (Signed)
 The patient is an active 79 year old female who returns for follow-up after having a right hip trochanteric injection by Dwyane Glad on 11/11/2023.  This was appropriate given the pain that she was having of the trochanteric aspect of her right hip.  X-rays of her pelvis and right hip were normal in terms of the joint space and I did review these today again and she has normal-appearing hip bilaterally.  She said the injection did not help at all.  She still denies any groin pain and only points to the lateral aspect of her right hip as source of her pain.  She is petite individual as well.  Her right and left hip moves smoothly and fluidly.  There is no blocks to rotation and no pain in the groin at all.  There is no sciatic pain and she has good strength.  There is pain only to palpation of the trochanteric area of her right hip.  At this point I would like to send her to outpatient physical therapy for any modalities that therapist can try including even dry needling to see if this will help.  Will then see her back in a month and we will consider even a repeat injection or even an MRI if she is not having improvements.

## 2024-01-13 ENCOUNTER — Telehealth: Payer: Self-pay | Admitting: Orthopaedic Surgery

## 2024-01-13 DIAGNOSIS — M25551 Pain in right hip: Secondary | ICD-10-CM

## 2024-01-13 NOTE — Addendum Note (Signed)
 Addended by: Catherene Close on: 01/13/2024 02:55 PM   Modules accepted: Orders

## 2024-01-13 NOTE — Telephone Encounter (Signed)
 Julia Perkins called from [facility] and has a referral that doesn't have her birthday or the diagnosis codes. CB#[phone]  If possible can you fax it. [phone]

## 2024-01-13 NOTE — Telephone Encounter (Signed)
 No referral was in the chart but was documented in Dr. Arvella Bird last note. Referral was placed and faxed

## 2024-01-28 ENCOUNTER — Telehealth (HOSPITAL_BASED_OUTPATIENT_CLINIC_OR_DEPARTMENT_OTHER): Payer: Self-pay

## 2024-01-28 ENCOUNTER — Other Ambulatory Visit (HOSPITAL_BASED_OUTPATIENT_CLINIC_OR_DEPARTMENT_OTHER): Payer: Self-pay | Admitting: Internal Medicine

## 2024-01-28 DIAGNOSIS — R1031 Right lower quadrant pain: Secondary | ICD-10-CM

## 2024-01-28 DIAGNOSIS — K439 Ventral hernia without obstruction or gangrene: Secondary | ICD-10-CM

## 2024-01-28 DIAGNOSIS — K469 Unspecified abdominal hernia without obstruction or gangrene: Secondary | ICD-10-CM | POA: Diagnosis not present

## 2024-01-31 ENCOUNTER — Telehealth (HOSPITAL_BASED_OUTPATIENT_CLINIC_OR_DEPARTMENT_OTHER): Payer: Self-pay

## 2024-02-10 ENCOUNTER — Ambulatory Visit: Admitting: Orthopaedic Surgery

## 2024-03-07 DIAGNOSIS — G894 Chronic pain syndrome: Secondary | ICD-10-CM | POA: Diagnosis not present

## 2024-03-07 DIAGNOSIS — M25511 Pain in right shoulder: Secondary | ICD-10-CM | POA: Diagnosis not present

## 2024-03-14 DIAGNOSIS — G894 Chronic pain syndrome: Secondary | ICD-10-CM | POA: Diagnosis not present

## 2024-03-14 DIAGNOSIS — M25511 Pain in right shoulder: Secondary | ICD-10-CM | POA: Diagnosis not present

## 2024-03-21 DIAGNOSIS — M25511 Pain in right shoulder: Secondary | ICD-10-CM | POA: Diagnosis not present

## 2024-03-21 DIAGNOSIS — G894 Chronic pain syndrome: Secondary | ICD-10-CM | POA: Diagnosis not present

## 2024-04-18 ENCOUNTER — Ambulatory Visit: Attending: Cardiology | Admitting: Cardiology

## 2024-04-18 ENCOUNTER — Other Ambulatory Visit: Payer: Self-pay | Admitting: Cardiology

## 2024-04-18 ENCOUNTER — Ambulatory Visit: Attending: Cardiology

## 2024-04-18 ENCOUNTER — Other Ambulatory Visit: Payer: Self-pay

## 2024-04-18 ENCOUNTER — Encounter: Payer: Self-pay | Admitting: Cardiology

## 2024-04-18 VITALS — BP 144/66 | HR 68 | Resp 16 | Ht 62.0 in | Wt 141.0 lb

## 2024-04-18 DIAGNOSIS — Z85528 Personal history of other malignant neoplasm of kidney: Secondary | ICD-10-CM

## 2024-04-18 DIAGNOSIS — I700 Atherosclerosis of aorta: Secondary | ICD-10-CM

## 2024-04-18 DIAGNOSIS — I639 Cerebral infarction, unspecified: Secondary | ICD-10-CM

## 2024-04-18 DIAGNOSIS — E782 Mixed hyperlipidemia: Secondary | ICD-10-CM | POA: Diagnosis not present

## 2024-04-18 DIAGNOSIS — I5032 Chronic diastolic (congestive) heart failure: Secondary | ICD-10-CM

## 2024-04-18 DIAGNOSIS — R0609 Other forms of dyspnea: Secondary | ICD-10-CM

## 2024-04-18 DIAGNOSIS — Z8673 Personal history of transient ischemic attack (TIA), and cerebral infarction without residual deficits: Secondary | ICD-10-CM

## 2024-04-18 DIAGNOSIS — I10 Essential (primary) hypertension: Secondary | ICD-10-CM | POA: Diagnosis not present

## 2024-04-18 DIAGNOSIS — Z905 Acquired absence of kidney: Secondary | ICD-10-CM

## 2024-04-18 MED ORDER — LOSARTAN POTASSIUM 25 MG PO TABS: 25.0000 mg | ORAL_TABLET | Freq: Every day | ORAL | 3 refills | Status: AC

## 2024-04-18 NOTE — Progress Notes (Unsigned)
 Cardiology Office Note:    NAME:  Julia Perkins    MRN: [id number] DOB:  January 15, 1946   PCP:  Cleotilde Corene BRAVO, PA  Former Cardiology Providers: NA Primary Cardiologist:  Madonna Large, DO, [facility] (established care 04/18/2024) Electrophysiologist:  None   Referring MD: Cleotilde, [place]  E, PA  Reason of Consult: History of CVA, chronic HFpEF  Chief Complaint  Patient presents with   History of CVA   Leg Swelling   New Patient (Initial Visit)    History of Present Illness:    Julia Perkins is a 78 y.o. Caucasian female whose past medical history and cardiovascular risk factors includes: History of stroke 05/2023 w/ residual expressive aphasia & difficult writing with right hand, chronic HFpEF, hyperlipidemia, hypothyroidism, hypertension, former smoker, Aortic atherosclerosis,  history of left nephrectomy due to renal cell carcinoma. She is being seen today for the evaluation of recent CVA and HFpEF at the request of Cleotilde, [place]  E, PA.  Patient is accompanied by her husband at today's office visit.  According to the note was provided by the PCP.  Patient carries a history of HFpEF.  There is a question that she used to follow with [facility] cardiology but patient is unsure and therefore referred to heart care for further evaluation and management as she has been experiencing lower extremity swelling.  History of stroke: Patient states that she was hospitalized in September 2024 due to stroke and focal neurological deficits.  She had to go to rehab thereafter and now referred to cardiology for further evaluation and management.  Patient states that residually she does has a degree of expressive aphasia as well as difficulty writing with her right hand.  Patient does not recall having ILR implanted or undergoing a cardiac monitor to evaluate for atrial fibrillation.  She did have an echocardiogram at [facility] which was reviewed in Care Everywhere.  Patient is currently on  Crestor for lipid management.  Heart failure with preserved EF: Patient states that she has not been hospitalized for congestive heart failure.  But has been reading on her EMR that she has HFpEF.  She is currently not on GDMT and is referred to cardiology for further evaluation and management and uptitrate medical therapy given her history of CKD and hx of renal cell carcinoma s/p left nephrectomy.  Patient endorses shortness of breath with effort related activities as well as lower extremity swelling.  She denies orthopnea or PND.  Electronic medical records also notes a prior history of diabetes but patient denies  Current Medications: Current Meds  Medication Sig   aspirin EC 325 MG tablet Take 325 mg by mouth daily.   furosemide (LASIX) 20 MG tablet Take 20 mg by mouth daily.   levothyroxine (SYNTHROID) 100 MCG tablet Take 100 mcg by mouth daily.   losartan  (COZAAR ) 25 MG tablet Take 1 tablet (25 mg total) by mouth daily.   Multiple Vitamins-Minerals (COMPLETE WOMENS PO) Take 1 tablet by mouth daily.   rosuvastatin (CRESTOR) 40 MG tablet Take 40 mg by mouth daily.   [DISCONTINUED] amLODipine (NORVASC) 10 MG tablet Take 10 mg by mouth daily.     Allergies:    Crestor [rosuvastatin], Lipitor [atorvastatin], and Simvastatin   Past Medical History: Past Medical History:  Diagnosis Date   Actinic keratoses    Atherosclerosis of aorta (HCC)    Basal cell carcinoma (BCC) of auricle of left ear    Cancer (HCC)    renal cell carcinoma   CHF (  congestive heart failure) (HCC)    Chronic kidney disease    Colon polyp    DM (diabetes mellitus) (HCC)    GERD (gastroesophageal reflux disease)    History of CVA (cerebrovascular accident)    History of left nephrectomy    Hypercholesterolemia    Hyperlipidemia    Hypertension    Hypothyroidism    Lower extremity edema    Menopausal state    Other sequelae of cerebral infarction    Plantar fasciitis    Plantar fasciitis     Postablative hypothyroidism    Primary insomnia    S/P abdominoplasty    Thyroid  disease    TIA (transient ischemic attack)     Past Surgical History: Past Surgical History:  Procedure Laterality Date   ABDOMINAL HYSTERECTOMY     ABDOMINOPLASTY  2008   CATARACT EXTRACTION     CESAREAN SECTION     CHOLECYSTECTOMY     ELBOW SURGERY  90?   left   FACELIFT  2006   HERNIA REPAIR  2012   KIDNEY SURGERY     NECK SURGERY  80's    Social History: Social History   Tobacco Use   Smoking status: Former    Current packs/day: 0.00    Types: Cigarettes    Quit date: 09/21/1996    Years since quitting: 27.5   Smokeless tobacco: Never  Substance Use Topics   Alcohol use: No   Drug use: No    Family History: Family History  Problem Relation Age of Onset   Cancer Brother        kidney    ROS:   Review of Systems  Cardiovascular:  Positive for dyspnea on exertion and leg swelling. Negative for chest pain, claudication, irregular heartbeat, near-syncope, orthopnea, palpitations, paroxysmal nocturnal dyspnea and syncope.  Respiratory:  Negative for shortness of breath.   Hematologic/Lymphatic: Negative for bleeding problem.  Neurological:        Expressive aphasia, difficult writing with right hand (after her stroke)    EKGs/Labs/Other Studies Reviewed:   EKG: EKG Interpretation Date/Time:  Tuesday April 18 2024 08:21:33 EDT Ventricular Rate:  63 PR Interval:  172 QRS Duration:  78 QT Interval:  432 QTC Calculation: 442 R Axis:   24  Text Interpretation: Normal sinus rhythm Low voltage QRS When compared with ECG of 25-May-2018 00:37, PREVIOUS ECG IS PRESENT Confirmed by Michele Richardson ([id number]) on 04/18/2024 8:28:23 AM  Echocardiogram: June 02, 2023 [facility], Care Everywhere Left Ventricle: Left ventricle size is normal.    Left Ventricle: Wall thickness is normal.    Left Ventricle: EF: 60-65%.    Left Ventricle: Wall motion is normal.    Left Ventricle: Doppler  parameters indicate normal diastolic function.    Left Atrium: Injection of agitated saline documents no interatrial  shunt.   Mitral Valve: There is mild to moderate regurgitation.    Aorta: The aortic root is normal in size. The ascending aorta is normal  in size.    Pericardium: There is a trivial pericardial effusion noted.    Labs:  Basic Metabolic Reviewed date:11/15/2023 09:19:36 AM Interpretation: Performing Lab: Notes/Report: Testing Performed at: [facility], [address], [address], [place], [place] [address]  Glucose 103 70-99 mg/dL    BUN 25 3-73 mg/dL    Creatinine 8.59 9.39-8.69 mg/dl    [id number] 39 >39 calc In accordance with recommendations from NKF-ASN Task Force, [facility] has updated its eGFR calc to the 2021 CKD-EDI equation that estimates kidney  function without a race variable;Stage 1 > 90 ML/Min plus Albuminuria;Stage 2 60-89 ML/MIN;Stage 3 30-59 ML/MIN;Stage 4 15-29 ML/MIN;Stage 5 <15 ML/MIN  Sodium 142 136-145 mmol/L    Potassium 3.5 3.5-5.5 mmol/L    Chloride 106 98-107 mmol/L    CO2 27 22-32 mmol/L    Anion Gap 11.9 6.0-20.0 mmol/L    Calcium 9.5 8.6-10.3 mg/dL    Albumin 4.3 6.5-5.1 g/dL    CA-corrected 0.78 1.39-89.69 mg/dL      Physical Exam:    Today's Vitals   04/18/24 0818  BP: (!) 144/66  Pulse: 68  Resp: 16  SpO2: 97%  Weight: 141 lb (64 kg)  Height: 5' 2 (1.575 m)   Body mass index is 25.79 kg/m. Wt Readings from Last 3 Encounters:  04/18/24 141 lb (64 kg)  10/31/19 159 lb (72.1 kg)  05/24/18 164 lb (74.4 kg)    Physical Exam  Constitutional: No distress.  hemodynamically stable  Neck: No JVD present.  Cardiovascular: Normal rate, regular rhythm, S1 normal and S2 normal. Exam reveals no gallop, no S3 and no S4.  No murmur heard. Pulses:      Dorsalis pedis pulses are 2+ on the right side and 2+ on the left side.       Posterior tibial pulses are 1+ on the right side and 1+ on the left side.  Pulmonary/Chest: Effort normal  and breath sounds normal. No stridor. She has no wheezes. She has no rales.  Musculoskeletal:        General: No edema.     Cervical back: Neck supple.  Skin: Skin is warm.   Impression & Recommendation(s):  Impression:   ICD-10-CM   1. Chronic heart failure with preserved ejection fraction (HFpEF) (HCC)  I50.32 ECHOCARDIOGRAM COMPLETE BUBBLE STUDY    Magnesium    Pro b natriuretic peptide (BNP)    Basic metabolic panel with GFR    Basic metabolic panel with GFR    Pro b natriuretic peptide (BNP)    Magnesium    CANCELED: Pro b natriuretic peptide (BNP)    CANCELED: Magnesium    2. History of CVA (cerebrovascular accident)  Z86.73 EKG 12-Lead    CANCELED: LONG TERM MONITOR (3-14 DAYS)    3. Benign hypertension  I10 losartan  (COZAAR ) 25 MG tablet    Basic metabolic panel with GFR    Basic metabolic panel with GFR    CANCELED: Basic metabolic panel with GFR    4. Mixed hyperlipidemia  E78.2     5. Atherosclerosis of aorta (HCC)  I70.0     6. History of nephrectomy  Z90.5     7. History of renal cell carcinoma  Z85.528        Recommendation(s):  Chronic heart failure with preserved ejection fraction (HFpEF) (HCC) Stage B, NYHA class II/III. Endorses shortness of breath with effort related activities as well as lower extremity swelling. Denies orthopnea or PND. Would be cautious with uptitration of GDMT given her history of CKD/history of nephrectomy due to renal cancer Discontinue amlodipine 10 mg p.o. daily. Start losartan  25 mg p.o. every afternoon. Continue current dose of Lasix 20 mg p.o. every morning BMP, NT proBNP and magnesium in one week to check renal function and electrolytes  History of CVA (cerebrovascular accident) Residual deficits include and deficits and expressive aphasia, per patient Index event June 02, 2023 Order echocardiogram with bubble study Cardiac monitor to evaluate for atrial fibrillation  Benign hypertension Office blood pressures  are acceptable but  not at goal. Medication changes as discussed above  Mixed hyperlipidemia Already on Crestor 40 mg p.o. daily  Orders Placed:  Orders Placed This Encounter  Procedures   Magnesium    Standing Status:   Future    Number of Occurrences:   1    Expected Date:   04/25/2024    Expiration Date:   04/18/2025   Pro b natriuretic peptide (BNP)    Standing Status:   Future    Number of Occurrences:   1    Expected Date:   04/25/2024    Expiration Date:   04/18/2025   Basic metabolic panel with GFR    Standing Status:   Future    Number of Occurrences:   1    Expected Date:   04/25/2024    Expiration Date:   04/18/2025   EKG 12-Lead   ECHOCARDIOGRAM COMPLETE BUBBLE STUDY    Standing Status:   Future    Expected Date:   04/25/2024    Expiration Date:   04/18/2025    Where [facility] this test [facility] performed[facility]    Does the patient weigh less than or greater than 250 lbs?:   Patient weighs less than 250 lbs    Perflutren DEFINITY (image enhancing agent) should be administered unless hypersensitivity or allergy exist:   Administer Perflutren    Reason for exam:   Other-Full Diagnosis List    Full ICD-10/Reason for Exam:   (HFpEF) heart failure with preserved ejection fraction (HCC) [[id number]]   Outside labs independently reviewed dated 11/11/2023. PCP notes from December 23, 2023 reviewed. Discussed management of at least 2 chronic comorbid conditions. Prescription drug management. Ordered additional diagnostic workup Coordination of care.  Final Medication List:    Meds ordered this encounter  Medications   losartan  (COZAAR ) 25 MG tablet    Sig: Take 1 tablet (25 mg total) by mouth daily.    Dispense:  90 tablet    Refill:  3     Current Outpatient Medications:    aspirin EC 325 MG tablet, Take 325 mg by mouth daily., Disp: , Rfl:    furosemide (LASIX) 20 MG tablet, Take 20 mg by mouth daily., Disp: , Rfl:    levothyroxine (SYNTHROID) 100 MCG tablet, Take 100  mcg by mouth daily., Disp: , Rfl:    losartan  (COZAAR ) 25 MG tablet, Take 1 tablet (25 mg total) by mouth daily., Disp: 90 tablet, Rfl: 3   Multiple Vitamins-Minerals (COMPLETE WOMENS PO), Take 1 tablet by mouth daily., Disp: , Rfl:    rosuvastatin (CRESTOR) 40 MG tablet, Take 40 mg by mouth daily., Disp: , Rfl:   Consent:   N/A  Disposition:   6 months sooner if needed Patient may be asked to follow-up sooner based on the results of the above-mentioned testing.  Her questions and concerns were addressed to her satisfaction. She voices understanding of the recommendations provided during this encounter.    Signed, Madonna Michele HAS, [facility]  A Division of [facility] [address] [place], [place] [address]  [place], [place] [address] 04/19/2024 2:55 PM

## 2024-04-18 NOTE — Progress Notes (Unsigned)
 Enrolled for [employer] to mail a ZIO XT long term holter monitor to the patients address on file.

## 2024-04-18 NOTE — Patient Instructions (Signed)
 Medication Instructions:  STOP Amlodipine  START Losartan  25 mg once daily in the evening  *If you need a refill on your cardiac medications before your next appointment, please call your pharmacy*  Lab Work: To be completed in 1 week: BMP, Pro-BNP, and magnesium  If you have labs (blood work) drawn today and your tests are completely normal, you will receive your results only by: MyChart Message (if you have MyChart) OR A paper copy in the mail If you have any lab test that is abnormal or we need to change your treatment, we will call you to review the results.  Testing/Procedures: Your physician has requested that you wear a Zio heart monitor for 14 days. This will be mailed to your home with instructions on how to apply the monitor and how to return it when finished. Please allow 2 weeks after returning the heart monitor before our office calls you with the results.   Your physician has requested that you have an echocardiogram with bubbles to be completed at next available time slot. Echocardiography is a painless test that uses sound waves to create images of your heart. It provides your doctor with information about the size and shape of your heart and how well your heart's chambers and valves are working. This procedure takes approximately one hour. There are no restrictions for this procedure. Please do NOT wear cologne, perfume, aftershave, or lotions (deodorant is allowed). Please arrive 15 minutes prior to your appointment time.  Please note: We ask at that you not bring children with you during ultrasound (echo/ vascular) testing. Due to room size and safety concerns, children are not allowed in the ultrasound rooms during exams. Our front office staff cannot provide observation of children in our lobby area while testing is being conducted. An adult accompanying a patient to their appointment will only be allowed in the ultrasound room at the discretion of the ultrasound technician  under special circumstances. We apologize for any inconvenience.   Follow-Up: At [facility], you and your health needs are our priority.  As part of our continuing mission to provide you with exceptional heart care, our providers are all part of one team.  This team includes your primary Cardiologist (physician) and Advanced Practice Providers or APPs (Physician Assistants and Nurse Practitioners) who all work together to provide you with the care you need, when you need it.  Your next appointment:   6 month(s)  Provider:   Madonna Large, DO    We recommend signing up for the patient portal called MyChart.  Sign up information is provided on this After Visit Summary.  MyChart is used to connect with patients for Virtual Visits (Telemedicine).  Patients are able to view lab/test results, encounter notes, upcoming appointments, etc.  Non-urgent messages can be sent to your provider as well.   To learn more about what you can do with MyChart, go to [email or web address].   Other Instructions ZIO XT- Long Term Monitor Instructions  Your physician has requested you wear a ZIO patch monitor for 14 days.   This is a single patch monitor. [employer] supplies one patch monitor per enrollment. Additional  stickers are not available. Please do not apply patch if you will be having a Nuclear Stress Test,  Echocardiogram, Cardiac CT, MRI, or Chest Xray during the period you would be wearing the  monitor. The patch cannot be worn during these tests. You cannot remove and re-apply the  ZIO XT patch monitor.   Your  ZIO patch monitor will be mailed 3 day USPS to your address on file. It may take 3-5 days  to receive your monitor after you have been enrolled.   Once you have received your monitor, please review the enclosed instructions. Your monitor  has already been registered assigning a specific monitor serial # to you.     Billing and Patient Assistance Program Information  We have  supplied [employer] with any of your insurance information on file for billing purposes.  [employer] offers a sliding scale Patient Assistance Program for patients that do not have  insurance, or whose insurance does not completely cover the cost of the ZIO monitor.  You must apply for the Patient Assistance Program to qualify for this discounted rate.   To apply, please call [employer] at [phone], select option 4, select option 2, ask to apply for  Patient Assistance Program. Meredeth will ask your household income, and how many people  are in your household. They will quote your out-of-pocket cost based on that information.  [employer] will also be able to set up a 47-month, interest-free payment plan if needed.     Applying the monitor  Shave hair from upper left chest.  Hold abrader disc by orange tab. Rub abrader in 40 strokes over the upper left chest as  indicated in your monitor instructions.  Clean area with 4 enclosed alcohol pads. Let dry.  Apply patch as indicated in monitor instructions. Patch will be placed under collarbone on left  side of chest with arrow pointing upward.  Rub patch adhesive wings for 2 minutes. Remove white label marked 1. Remove the white  label marked 2. Rub patch adhesive wings for 2 additional minutes.  While looking in a mirror, press and release button in center of patch. A small green light will  flash 3-4 times. This will be your only indicator that the monitor has been turned on.  Do not shower for the first 24 hours. You may shower after the first 24 hours.  Press the button if you feel a symptom. You will hear a small click. Record Date, Time and  Symptom in the Patient Logbook.  When you are ready to remove the patch, follow instructions on the last 2 pages of Patient  Logbook. Stick patch monitor onto the last page of Patient Logbook.   Place Patient Logbook in the blue and white box. Use locking tab on box and tape box closed  securely. The  blue and white box has prepaid postage on it. Please place it in the mailbox as  soon as possible. Your physician should have your test results approximately 7 days after the  monitor has been mailed back to [facility].   Call [facility] Customer Care at [phone] if you have questions regarding  your ZIO XT patch monitor. Call them immediately if you see an orange light blinking on your  monitor.   If your monitor falls off in less than 4 days, contact our Monitor department at [phone].   If your monitor becomes loose or falls off after 4 days call [employer] at [phone] for  suggestions on securing your monitor.

## 2024-04-19 ENCOUNTER — Encounter: Payer: Self-pay | Admitting: Cardiology

## 2024-05-25 ENCOUNTER — Ambulatory Visit (HOSPITAL_COMMUNITY)
Admission: RE | Admit: 2024-05-25 | Discharge: 2024-05-25 | Disposition: A | Discharge status: Home / Self Care | Source: Ambulatory Visit | Attending: Cardiology | Admitting: Cardiology

## 2024-05-25 DIAGNOSIS — I5032 Chronic diastolic (congestive) heart failure: Secondary | ICD-10-CM | POA: Diagnosis not present

## 2024-05-25 LAB — ECHOCARDIOGRAM COMPLETE BUBBLE STUDY
Area-P 1/2: 3.46 cm2
S' Lateral: 3 cm

## 2024-05-27 ENCOUNTER — Ambulatory Visit: Payer: Self-pay | Admitting: Cardiology

## 2024-05-27 DIAGNOSIS — I639 Cerebral infarction, unspecified: Secondary | ICD-10-CM | POA: Diagnosis not present

## 2024-05-27 DIAGNOSIS — R0609 Other forms of dyspnea: Secondary | ICD-10-CM | POA: Diagnosis not present

## 2024-06-10 ENCOUNTER — Ambulatory Visit: Payer: Self-pay | Admitting: Cardiology

## 2024-06-10 DIAGNOSIS — I5032 Chronic diastolic (congestive) heart failure: Secondary | ICD-10-CM

## 2024-06-10 DIAGNOSIS — R0609 Other forms of dyspnea: Secondary | ICD-10-CM

## 2024-06-10 DIAGNOSIS — I639 Cerebral infarction, unspecified: Secondary | ICD-10-CM | POA: Diagnosis not present

## 2024-06-10 DIAGNOSIS — Z8673 Personal history of transient ischemic attack (TIA), and cerebral infarction without residual deficits: Secondary | ICD-10-CM | POA: Diagnosis not present

## 2024-06-12 NOTE — Communication Body (Signed)
 Communications Note ___________________________________________________________ Patient Name Julia Perkins Date of Birth 1945-10-29 Date of Call 04/20/2024 Home phone [phone] Cell phone [phone] Day phone Alternate phone ___________________________________________________________ Beatris with: Time of call: 10:26 AM Call taken by: Roseline MICAEL Lever Contact type: Call type: other Telephone Contact Detail Date  Time  Employee  Detail   04/20/2024  10:27 AM  Roseline MICAEL Lever  Left Message Quality and Outcomes patient, transfer to [phone]    Provider: Gillie MD, Rockey CROME 04/20/2024 10:27 AM    Document generated by: Roseline MICAEL Lever

## 2024-06-15 NOTE — Progress Notes (Signed)
 The patient has been notified of the result and verbalized understanding. All questions (if any) were answered.  Patient went ahead and made an appointment with Dr. Michele for 06/26/24 @11 :40. Per Dr. Michele Episodes of paroxysmal supraventricular tachycardia some episodes are difficult to interpret due to baseline artifact. Given her history of stroke please have her come in to discuss possible ILR implant.

## 2024-06-26 ENCOUNTER — Ambulatory Visit: Admitting: Cardiology

## 2024-06-27 ENCOUNTER — Ambulatory Visit: Attending: Cardiology | Admitting: Cardiology

## 2024-06-27 ENCOUNTER — Encounter: Payer: Self-pay | Admitting: Cardiology

## 2024-06-27 VITALS — BP 148/76 | HR 64 | Resp 16 | Ht 62.0 in | Wt 150.0 lb

## 2024-06-27 DIAGNOSIS — E782 Mixed hyperlipidemia: Secondary | ICD-10-CM | POA: Diagnosis not present

## 2024-06-27 DIAGNOSIS — Z85528 Personal history of other malignant neoplasm of kidney: Secondary | ICD-10-CM | POA: Diagnosis not present

## 2024-06-27 DIAGNOSIS — I10 Essential (primary) hypertension: Secondary | ICD-10-CM

## 2024-06-27 DIAGNOSIS — I5032 Chronic diastolic (congestive) heart failure: Secondary | ICD-10-CM

## 2024-06-27 DIAGNOSIS — Z8673 Personal history of transient ischemic attack (TIA), and cerebral infarction without residual deficits: Secondary | ICD-10-CM

## 2024-06-27 DIAGNOSIS — I700 Atherosclerosis of aorta: Secondary | ICD-10-CM | POA: Diagnosis not present

## 2024-06-27 DIAGNOSIS — Z905 Acquired absence of kidney: Secondary | ICD-10-CM

## 2024-06-27 MED ORDER — SPIRONOLACTONE 25 MG PO TABS: 25.0000 mg | ORAL_TABLET | Freq: Every day | ORAL | 3 refills | Status: AC

## 2024-06-27 NOTE — Patient Instructions (Signed)
 Medication Instructions:  Your physician has recommended you make the following change in your medication:   1) STOP amlodipine 2) START spironolactone 25 mg daily 3) CHANGE furosemide (Lasix) to 20 mg daily as needed for weight gain of 1 lb or more overnight or 3 lbs or more in 1 week  *If you need a refill on your cardiac medications before your next appointment, please call your pharmacy*  Lab Work: TODAY: BMP, BNP In 1 week: repeat BMP, BNP If you have labs (blood work) drawn today and your tests are completely normal, you will receive your results only by: MyChart Message (if you have MyChart) OR A paper copy in the mail If you have any lab test that is abnormal or we need to change your treatment, we will call you to review the results.  Follow-Up: At [facility], you and your health needs are our priority.  As part of our continuing mission to provide you with exceptional heart care, our providers are all part of one team.  This team includes your primary Cardiologist (physician) and Advanced Practice Providers or APPs (Physician Assistants and Nurse Practitioners) who all work together to provide you with the care you need, when you need it.  Your next appointment:   6 month(s)  Provider:   Madonna Large, DO   We recommend signing up for the patient portal called MyChart.  Sign up information is provided on this After Visit Summary.  MyChart is used to connect with patients for Virtual Visits (Telemedicine).  Patients are able to view lab/test results, encounter notes, upcoming appointments, etc.  Non-urgent messages can be sent to your provider as well.    To learn more about what you can do with MyChart, go to [email or web address].   Other Instructions You have been referred to our electrophysiologist Dr. Nancey for loop recorder implant. You will be scheduled to see him on August 03, 2024 at 8:30 AM. You will see him at the same location, [address]  floor.

## 2024-06-27 NOTE — Progress Notes (Signed)
 Cardiology Office Note:    NAME:  Julia Perkins    MRN: [id number] DOB:  11-28-1945   PCP:  Cleotilde, [place]  E, PA  Former Cardiology Providers: NA Primary Cardiologist:  Madonna Large, DO, [facility] (established care 04/18/2024) Electrophysiologist:  None    Chief Complaint  Patient presents with   Chronic heart failure with preserved ejection fraction    Results   Follow-up    History of Present Illness:    Julia Perkins is a 78 y.o. Caucasian female whose past medical history and cardiovascular risk factors includes: History of stroke 05/2023 w/ residual expressive aphasia & difficult writing with right hand, chronic HFpEF, hyperlipidemia, hypothyroidism, hypertension, former smoker, Aortic atherosclerosis,  history of left nephrectomy due to renal cell carcinoma.   Patient is accompanied by her husband at today's office visit.  History of stroke: Patient states that she was hospitalized in September 2024 due to stroke and focal neurological deficits.  She had to go to rehab thereafter and now referred to cardiology Patient states that residually she does has a degree of expressive aphasia as well as difficulty writing with her right hand.  At the last office visit recommended an echocardiogram with bubble study which was negative for interatrial shunting.  At last office visit patient was recommended to undergo cardiac monitor to evaluate for possible atrial fibrillation.  Results of the cardiac monitor reviewed with the patient, average heart rate 73 bpm, no obvious evidence of atrial fibrillation during the monitoring period.  However, she had frequent episodes of PSVT which raises a concern for possible A-fib/flutter.  Requested that they come in sooner than her scheduled visit to discuss considering implantable loop recorder for long-term monitoring.  Heart failure with preserved EF: Prior to establishing care patient carried a diagnosis of heart failure.  Echocardiogram was  ordered at the last office visit which notes preserved LVEF with grade 1 diastolic dysfunction.  Given her symptoms of shortness of breath and lower extremity swelling I had discontinued amlodipine and transition her to losartan  25 mg p.o. daily.  Her other GDMT medications include metoprolol and Lasix.  I had also ordered labs at the last office visit which are not performed.  Husband states that the shortness of breath has improved with addition of ARB.  She denies orthopnea, PND, or lower extremity swelling.  Current Medications: Current Meds  Medication Sig   Cholecalciferol (VITAMIN D-3) 125 MCG (5000 UT) TABS Take by mouth.   spironolactone (ALDACTONE) 25 MG tablet Take 1 tablet (25 mg total) by mouth daily.   vitamin B-12 (CYANOCOBALAMIN) 500 MCG tablet Take 500 mcg by mouth daily.     Allergies:    Crestor [rosuvastatin], Lipitor [atorvastatin], and Simvastatin   Past Medical History: Past Medical History:  Diagnosis Date   Actinic keratoses    Atherosclerosis of aorta    Basal cell carcinoma (BCC) of auricle of left ear    Cancer (HCC)    renal cell carcinoma   CHF (congestive heart failure) (HCC)    Chronic kidney disease    Colon polyp    DM (diabetes mellitus) (HCC)    GERD (gastroesophageal reflux disease)    History of CVA (cerebrovascular accident)    History of left nephrectomy    Hypercholesterolemia    Hyperlipidemia    Hypertension    Hypothyroidism    Lower extremity edema    Menopausal state    Other sequelae of cerebral infarction    Plantar fasciitis  Plantar fasciitis    Postablative hypothyroidism    Primary insomnia    S/P abdominoplasty    Thyroid  disease    TIA (transient ischemic attack)     Past Surgical History: Past Surgical History:  Procedure Laterality Date   ABDOMINAL HYSTERECTOMY     ABDOMINOPLASTY  2008   CATARACT EXTRACTION     CESAREAN SECTION     CHOLECYSTECTOMY     ELBOW SURGERY  90?   left   FACELIFT  2006   HERNIA  REPAIR  2012   KIDNEY SURGERY     NECK SURGERY  80's    Social History: Social History   Tobacco Use   Smoking status: Former    Current packs/day: 0.00    Types: Cigarettes    Quit date: 09/21/1996    Years since quitting: 27.7   Smokeless tobacco: Never  Substance Use Topics   Alcohol use: No   Drug use: No    Family History: Family History  Problem Relation Age of Onset   Cancer Brother        kidney    ROS:   Review of Systems  Cardiovascular:  Positive for dyspnea on exertion and leg swelling. Negative for chest pain, claudication, irregular heartbeat, near-syncope, orthopnea, palpitations, paroxysmal nocturnal dyspnea and syncope.  Respiratory:  Negative for shortness of breath.   Hematologic/Lymphatic: Negative for bleeding problem.  Neurological:        Expressive aphasia, difficult writing with right hand (after her stroke)    EKGs/Labs/Other Studies Reviewed:   Echocardiogram: June 02, 2023 [facility], Care Everywhere: LVEF 60 to 65%, normal diastolic function, trivial pericardial effusion.  05/25/2024 1. Left ventricular ejection fraction, by estimation, is 60 to 65%. Left ventricular ejection fraction by 3D volume is 55 %. The left ventricle has normal function. The left ventricle has no regional wall motion abnormalities. Left ventricular diastolic parameters are consistent with Grade I diastolic dysfunction (impaired relaxation). The average left ventricular global longitudinal strain is -17.9 %. The global longitudinal strain is normal. 2. Right ventricular systolic function is normal. The right ventricular size is normal. There is normal pulmonary artery systolic pressure. The estimated right ventricular systolic pressure is 22.4 mmHg. 3. The mitral valve is normal in structure. Mild mitral valve regurgitation. No evidence of mitral stenosis. 4. The aortic valve is normal in structure. Aortic valve regurgitation is not visualized. No aortic stenosis  is present. 5. The inferior vena cava is normal in size with greater than 50% respiratory variability, suggesting right atrial pressure of 3 mmHg. 6. Agitated saline contrast bubble study was negative, with no evidence of any interatrial shunt.  Cardiac monitor (Zio Patch): May 03, 2024 - May 17, 2024 Dominant rhythm sinus rhythm.  Heart rate 54-226 bpm. Avg HR 73 bpm. No atrial fibrillation detected during the monitoring period. No ventricular tachycardia, high grade AV block, pauses (3 seconds or longer). Total supraventricular ectopic burden <1%. Auto triggered event, fastest episode of PSVT 5:05 PM on 05/11/2024, 13 beats, 4.1 seconds, average HR 190 bpm, max HR 226 bpm. Overall accuracy of interpretation limited due to baseline artifact. Total ventricular ectopic burden 1.3%. Patient triggered events: 0.   Labs:  Basic Metabolic Reviewed date:11/15/2023 09:19:36 AM Interpretation: Performing Lab: Notes/Report: Testing Performed at: [facility], [address], [place], [place] [address]  Glucose 103 70-99 mg/dL    BUN 25 3-73 mg/dL    Creatinine 8.59 9.39-8.69 mg/dl    [id number] 39 >39 calc In accordance  with recommendations from NKF-ASN Task Force, [facility] has updated its eGFR calc to the 2021 CKD-EDI equation that estimates kidney function without a race variable;Stage 1 > 90 ML/Min plus Albuminuria;Stage 2 60-89 ML/MIN;Stage 3 30-59 ML/MIN;Stage 4 15-29 ML/MIN;Stage 5 <15 ML/MIN  Sodium 142 136-145 mmol/L    Potassium 3.5 3.5-5.5 mmol/L    Chloride 106 98-107 mmol/L    CO2 27 22-32 mmol/L    Anion Gap 11.9 6.0-20.0 mmol/L    Calcium 9.5 8.6-10.3 mg/dL    Albumin 4.3 6.5-5.1 g/dL    CA-corrected 0.78 1.39-89.69 mg/dL      Physical Exam:    Today's Vitals   06/27/24 0845  BP: (!) 148/76  Pulse: 64  Resp: 16  SpO2: 97%  Weight: 150 lb (68 kg)  Height: 5' 2 (1.575 m)   Body mass index is 27.44 kg/m. Wt Readings from Last 3 Encounters:  06/27/24 150  lb (68 kg)  04/18/24 141 lb (64 kg)  10/31/19 159 lb (72.1 kg)    Physical Exam  Constitutional: No distress.  hemodynamically stable  Neck: No JVD present.  Cardiovascular: Normal rate, regular rhythm, S1 normal and S2 normal. Exam reveals no gallop, no S3 and no S4.  No murmur heard. Pulses:      Dorsalis pedis pulses are 2+ on the right side and 2+ on the left side.       Posterior tibial pulses are 1+ on the right side and 1+ on the left side.  Pulmonary/Chest: Effort normal and breath sounds normal. No stridor. She has no wheezes. She has no rales.  Musculoskeletal:        General: No edema.     Cervical back: Neck supple.  Skin: Skin is warm.   Impression & Recommendation(s):  Impression:   ICD-10-CM   1. Chronic heart failure with preserved ejection fraction (HFpEF) (HCC)  I50.32 Pro b natriuretic peptide (BNP)    Pro b natriuretic peptide (BNP)    2. Benign hypertension  I10 Basic metabolic panel with GFR    Basic metabolic panel with GFR    3. History of CVA (cerebrovascular accident)  Z86.73 Ambulatory referral to Cardiac Electrophysiology    4. Mixed hyperlipidemia  E78.2     5. Atherosclerosis of aorta  I70.0     6. History of nephrectomy  Z90.5     7. History of renal cell carcinoma  Z85.528         Recommendation(s):  Chronic heart failure with preserved ejection fraction (HFpEF) (HCC) Stage B, NYHA class II Echocardiogram notes preserved LVEF, grade 1 diastolic dysfunction Follow-up labs after initiating losartan  are still pending.  Check BMP and BNP today. At the last office visit patient was recommended to stop amlodipine, but she continues to take it. Re emphasized importance of stopping amlodipine so that we can uptitrate GDMT. Stop Lasix 20 mg p.o. daily.   Recommend using Lasix as needed if she gains more than 1 pound over 24 hours or 3 pounds over 7 days. Start spironolactone 25 mg p.o. every morning Will need repeat BMP and BNP in 1  week.  Benign hypertension Office blood pressures are acceptable but still not at goal. Medication changes as mentioned above  History of CVA (cerebrovascular accident) Residual deficits include expressive aphasia per patient. Index event June 02, 2023 Echocardiogram noted preserved LVEF, and bubble study negative for PFO Cardiac monitor did not explicitly illustrate atrial fibrillation.  However she has frequent episodes of PSVT which predisposes her to A-fib.  Recommended considering ILR implant for long-term monitoring for A-fib.  After discussing the risks, benefits, and alternatives patient and husband willing to proceed forward.  They also do understand that routine monitoring of ILR involves monitoring fees.  Will defer to EP for further evaluation and management, consult placed  Mixed hyperlipidemia Continue rosuvastatin 40 mg p.o. nightly  Orders Placed:  Orders Placed This Encounter  Procedures   Basic metabolic panel with GFR   Pro b natriuretic peptide (BNP)   Basic metabolic panel with GFR    Standing Status:   Future    Expected Date:   07/04/2024    Expiration Date:   10/02/2024   Pro b natriuretic peptide (BNP)    Standing Status:   Future    Expected Date:   07/04/2024    Expiration Date:   10/02/2024   Ambulatory referral to Cardiac Electrophysiology    Referral Priority:   Routine    Referral Type:   Consultation    Referral Reason:   Specialty Services Required    Referred to Provider:   Mealor, Eulas BRAVO, MD    Requested Specialty:   Cardiology    Number of Visits Requested:   1     Final Medication List:    Meds ordered this encounter  Medications   spironolactone (ALDACTONE) 25 MG tablet    Sig: Take 1 tablet (25 mg total) by mouth daily.    Dispense:  90 tablet    Refill:  3     Current Outpatient Medications:    Cholecalciferol (VITAMIN D-3) 125 MCG (5000 UT) TABS, Take by mouth., Disp: , Rfl:    spironolactone (ALDACTONE) 25 MG tablet,  Take 1 tablet (25 mg total) by mouth daily., Disp: 90 tablet, Rfl: 3   vitamin B-12 (CYANOCOBALAMIN) 500 MCG tablet, Take 500 mcg by mouth daily., Disp: , Rfl:    aspirin EC 325 MG tablet, Take 325 mg by mouth daily., Disp: , Rfl:    furosemide (LASIX) 20 MG tablet, Take 20 mg by mouth daily as needed for edema or fluid (weight gain of 1 lb or more overnight, or 3 lbs or more in 1 week.)., Disp: , Rfl:    levothyroxine (SYNTHROID) 100 MCG tablet, Take 100 mcg by mouth daily., Disp: , Rfl:    losartan  (COZAAR ) 25 MG tablet, Take 1 tablet (25 mg total) by mouth daily., Disp: 90 tablet, Rfl: 3   metoprolol tartrate (LOPRESSOR) 25 MG tablet, Take 25 mg by mouth 2 (two) times daily., Disp: , Rfl:    Multiple Vitamins-Minerals (COMPLETE WOMENS PO), Take 1 tablet by mouth daily., Disp: , Rfl:    omeprazole  (PRILOSEC) 20 MG capsule, Take 20 mg by mouth daily., Disp: , Rfl:    rosuvastatin (CRESTOR) 40 MG tablet, Take 40 mg by mouth daily., Disp: , Rfl:   Consent:   N/A  Disposition:   6 months sooner if needed Patient may be asked to follow-up sooner based on the results of the above-mentioned testing.  Her questions and concerns were addressed to her satisfaction. She voices understanding of the recommendations provided during this encounter.    Signed, Madonna Michele HAS, [facility]  A Division of [facility] [address], [place], [place] [address]  [place], [place] [address] 06/27/2024 6:42 PM

## 2024-06-28 LAB — BASIC METABOLIC PANEL WITH GFR
BUN/Creatinine Ratio: 16 (ref 12–28)
BUN: 28 mg/dL — ABNORMAL HIGH (ref 8–27)
CO2: 24 mmol/L (ref 20–29)
Calcium: 10.3 mg/dL (ref 8.7–10.3)
Chloride: 102 mmol/L (ref 96–106)
Creatinine, Ser: 1.75 mg/dL — ABNORMAL HIGH (ref 0.57–1.00)
Glucose: 95 mg/dL (ref 70–99)
Potassium: 3.7 mmol/L (ref 3.5–5.2)
Sodium: 143 mmol/L (ref 134–144)
eGFR: 30 mL/min/1.73 — ABNORMAL LOW (ref >59)

## 2024-06-28 LAB — PRO B NATRIURETIC PEPTIDE: NT-Pro BNP: 694 pg/mL (ref 0–738)

## 2024-06-29 ENCOUNTER — Ambulatory Visit: Payer: Self-pay | Admitting: Cardiology

## 2024-06-30 NOTE — Progress Notes (Signed)
 Attempted to call patient for her lab results. No answer left a vm to return the call back.

## 2024-07-04 DIAGNOSIS — I5032 Chronic diastolic (congestive) heart failure: Secondary | ICD-10-CM | POA: Diagnosis not present

## 2024-07-04 DIAGNOSIS — I10 Essential (primary) hypertension: Secondary | ICD-10-CM | POA: Diagnosis not present

## 2024-07-05 ENCOUNTER — Ambulatory Visit: Payer: Self-pay | Admitting: Cardiology

## 2024-07-05 DIAGNOSIS — I5032 Chronic diastolic (congestive) heart failure: Secondary | ICD-10-CM

## 2024-07-05 LAB — BASIC METABOLIC PANEL WITH GFR
BUN/Creatinine Ratio: 13 (ref 12–28)
BUN: 28 mg/dL — ABNORMAL HIGH (ref 8–27)
CO2: 24 mmol/L (ref 20–29)
Calcium: 9.8 mg/dL (ref 8.7–10.3)
Chloride: 99 mmol/L (ref 96–106)
Creatinine, Ser: 2.13 mg/dL — ABNORMAL HIGH (ref 0.57–1.00)
Glucose: 88 mg/dL (ref 70–99)
Potassium: 3.6 mmol/L (ref 3.5–5.2)
Sodium: 141 mmol/L (ref 134–144)
eGFR: 23 mL/min/1.73 — ABNORMAL LOW (ref >59)

## 2024-07-05 LAB — PRO B NATRIURETIC PEPTIDE: NT-Pro BNP: 367 pg/mL (ref 0–738)

## 2024-07-05 LAB — MAGNESIUM: Magnesium: 2.3 mg/dL (ref 1.6–2.3)

## 2024-07-05 NOTE — Telephone Encounter (Signed)
 Spoke with pt and her husband who helps manage her medications.  They state pt is not taking any NSAIDS.  They believe she is no longer taking Amlodipine.  Confirmed pt has started Spironolactone and is only taking Furosemide as needed which has not been daily.  She denies any new symptoms. Advised will forward information to Dr Michele for further review and recommendation.  Pt verbalizes understanding and thanked Charity fundraiser for the call.

## 2024-07-24 ENCOUNTER — Encounter: Payer: Self-pay | Admitting: Radiology

## 2024-07-25 ENCOUNTER — Ambulatory Visit: Admitting: Cardiology

## 2024-07-26 LAB — BASIC METABOLIC PANEL WITH GFR
BUN/Creatinine Ratio: 16 (ref 12–28)
BUN: 26 mg/dL (ref 8–27)
CO2: 18 mmol/L — ABNORMAL LOW (ref 20–29)
Calcium: 10 mg/dL (ref 8.7–10.3)
Chloride: 104 mmol/L (ref 96–106)
Creatinine, Ser: 1.62 mg/dL — ABNORMAL HIGH (ref 0.57–1.00)
Glucose: 90 mg/dL (ref 70–99)
Potassium: 4.4 mmol/L (ref 3.5–5.2)
Sodium: 141 mmol/L (ref 134–144)
eGFR: 33 mL/min/1.73 — ABNORMAL LOW (ref >59)

## 2024-08-02 NOTE — Progress Notes (Signed)
 Electrophysiology Office Note:    Date:  08/03/2024   ID:  Avelina DELENA Lesches, DOB May 25, 1946, MRN [id number]  PCP:  Cleotilde Beula BRAVO, PA   [facility] Providers Cardiologist:  Madonna Large, DO     Referring MD: Large Madonna, DO   History of Present Illness:    SHAMONA WIRTZ is a 78 y.o. female with a medical history significant for stroke, heart failure with preserved ejection fraction, aortic atherosclerosis, left nephrectomy due to renal cell carcinoma, referred for loop recorder placement due to stroke.       History of Present Illness  She had a stroke in September 2024 with residual expressive aphasia and difficulty writing with her right hand.  Her evaluation included an echocardiogram that did not show any intracardiac shunting.  A 14-day monitor showed sinus rhythm with occasional episodes of SVT consistent with atrial tachycardia but no evidence of atrial fibrillation         Today, she is at baseline reports that she is doing well.  EKGs/Labs/Other Studies Reviewed Today:     Echocardiogram:  TTE September 2025 LVEF 60 to 65%.  Grade 1 diastolic dysfunction.  Negative bubble study   Monitors:  14-day day monitor August 2025-- my interpretation Sinus rhythm, heart rate 54 217 bpm, average 73 bpm No symptoms reported 71 episodes of SVT, likely atrial tachycardia.  The longest episode was approximately 32 seconds.  No atrial fibrillation detected   EKG:   EKG Interpretation Date/Time:  Thursday August 03 2024 08:22:59 EST Ventricular Rate:  76 PR Interval:  172 QRS Duration:  70 QT Interval:  364 QTC Calculation: 409 R Axis:   39  Text Interpretation: Normal sinus rhythm Normal ECG When compared with ECG of 18-Apr-2024 08:21, No significant change was found Confirmed by Nancey Scotts [phone]) on 08/03/2024 8:30:11 AM     Physical Exam:    VS:  BP 120/78 (BP Location: Right Arm, Patient Position: Sitting, Cuff Size: Normal)    Pulse 76   Ht 5' 2 (1.575 m)   Wt 148 lb (67.1 kg)   SpO2 98%   BMI 27.07 kg/m     Wt Readings from Last 3 Encounters:  08/03/24 148 lb (67.1 kg)  06/27/24 150 lb (68 kg)  04/18/24 141 lb (64 kg)     GEN: Well nourished, well developed in no acute distress CARDIAC: RRR, no murmurs, rubs, gallops RESPIRATORY:  Normal work of breathing MUSCULOSKELETAL: no edema    ASSESSMENT & PLAN:     History of stroke June 02, 2023 With residual aphasia and right hand weakness Atrial tachycardia noticed on short-term monitoring, concerning for increased rates of atrial fibrillation We discussed the indication and rationale for long-term monitoring with a loop recorder.  I explained the logistics of the implant, risks including bleeding, infection, and the monitoring process with its associated monthly fee.  She would like to proceed.  CHF with preserved ejection fraction Grade 1 diastolic dysfunction on echo  Signed, Scotts BRAVO Nancey, MD  08/03/2024 8:41 AM    [facility]  SURGEON:  Scotts BRAVO Nancey, MD     PREPROCEDURE DIAGNOSIS:  Cryptogenic stroke    POSTPROCEDURE DIAGNOSIS:  Cryptogenic stroke     PROCEDURES:   1. Implantable loop recorder implantation    INTRODUCTION:   BURNETTE VALENTI  is a 78 y.o. patient with a history of cryptogenic stroke. Inpatient telemetry has been reviewed and not shown atrial fibrillation. The patient therefore presents today for  implantable loop implantation. The costs of loop recorder monitoring have been discussed with the patient.    DESCRIPTION OF PROCEDURE:  Informed written consent was obtained.  A timeout was performed. The patient required no sedation for the procedure today.  The patients left chest was prepped and draped in the usual sterile fashion. The skin overlying the left parasternal region was infiltrated with lidocaine  for local analgesia.  A 0.5-cm incision was made over the left parasternal region over the 3rd  intercostal space.  A [employer]onic implantable loop recorder (SN# **[id number]) was then placed into the pocket  R waves were very prominent and measured >0.44mV.  Steri- Strips and a sterile dressing were then applied.  There were no early apparent complications.     CONCLUSIONS:   1. Successful implantation of a [employer]tronic implantable loop recorder for a history of cryptogenic stroke  2. No early apparent complications.   Eulas FORBES Furbish, MD  Cardiac Electrophysiology

## 2024-08-03 ENCOUNTER — Ambulatory Visit: Attending: Cardiovascular Disease | Admitting: Cardiovascular Disease

## 2024-08-03 ENCOUNTER — Encounter: Payer: Self-pay | Admitting: Cardiovascular Disease

## 2024-08-03 VITALS — BP 120/78 | HR 76 | Ht 62.0 in | Wt 148.0 lb

## 2024-08-03 DIAGNOSIS — I5032 Chronic diastolic (congestive) heart failure: Secondary | ICD-10-CM

## 2024-08-03 DIAGNOSIS — I639 Cerebral infarction, unspecified: Secondary | ICD-10-CM

## 2024-08-03 NOTE — Patient Instructions (Signed)
Medication Instructions:  Your physician recommends that you continue on your current medications as directed. Please refer to the Current Medication list given to you today.  Labwork: None ordered.  Testing/Procedures: None ordered.  Follow-Up:  Your physician wants you to follow-up in: one year with Dr. Mealor.  You will receive a reminder letter in the mail two months in advance. If you don't receive a letter, please call our office to schedule the follow-up appointment.    Implantable Loop Recorder Placement, Care After This sheet gives you information about how to care for yourself after your procedure. Your health care provider may also give you more specific instructions. If you have problems or questions, contact your health care provider. What can I expect after the procedure? After the procedure, it is common to have: Soreness or discomfort near the incision. Some swelling or bruising near the incision.  Follow these instructions at home: Incision care  Monitor your cardiac device site for redness, swelling, and drainage. Call the device clinic at [phone] if you experience these symptoms or fever/chills.  Keep the large square bandage on your site for 24 hours and then you may remove it yourself. Keep the steri-strips underneath in place.   You may shower after 72 hours / 3 days from your procedure with the steri-strips in place. They will usually fall off on their own, or may be removed after 10 days. Pat dry.   Avoid lotions, ointments, or perfumes over your incision until it is well-healed.  Please do not submerge in water until your site is completely healed.   Your device is MRI compatible.   Remote monitoring is used to monitor your cardiac device from home. This monitoring is scheduled every month by our office. It allows us to keep an eye on the function of your device to ensure it is working properly.  If your wound site starts to bleed apply pressure.     For help with the monitor please call [employer] Monitor Support Specialist directly at [phone].    If you have any questions/concerns please call the device clinic at [phone].  Activity  Return to your normal activities.  General instructions Follow instructions from your health care provider about how to manage your implantable loop recorder and transmit the information. Learn how to activate a recording if this is necessary for your type of device. You may go through a metal detection gate, and you may let someone hold a metal detector over your chest. Show your ID card if needed. Do not have an MRI unless you check with your health care provider first. Take over-the-counter and prescription medicines only as told by your health care provider. Keep all follow-up visits as told by your health care provider. This is important. Contact a health care provider if: You have redness, swelling, or pain around your incision. You have a fever. You have pain that is not relieved by your pain medicine. You have triggered your device because of fainting (syncope) or because of a heartbeat that feels like it is racing, slow, fluttering, or skipping (palpitations). Get help right away if you have: Chest pain. Difficulty breathing. Summary After the procedure, it is common to have soreness or discomfort near the incision. Change your dressing as told by your health care provider. Follow instructions from your health care provider about how to manage your implantable loop recorder and transmit the information. Keep all follow-up visits as told by your health care provider. This is important. This information   is not intended to replace advice given to you by your health care provider. Make sure you discuss any questions you have with your health care provider. Document Released: 08/19/2015 Document Revised: 10/23/2017 Document Reviewed: 10/23/2017 Elsevier Patient Education  2020 [employer]  

## 2024-09-03 ENCOUNTER — Ambulatory Visit: Attending: Cardiovascular Disease

## 2024-09-03 DIAGNOSIS — I639 Cerebral infarction, unspecified: Secondary | ICD-10-CM

## 2024-09-05 LAB — CUP PACEART REMOTE DEVICE CHECK
Date Time Interrogation Session: 20251214160316
Implantable Pulse Generator Implant Date: 20251113

## 2024-09-08 NOTE — Progress Notes (Signed)
 Remote Loop Recorder Transmission

## 2024-09-20 ENCOUNTER — Ambulatory Visit: Payer: Self-pay | Admitting: Cardiovascular Disease

## 2024-10-04 ENCOUNTER — Ambulatory Visit: Attending: Internal Medicine

## 2024-11-04 ENCOUNTER — Ambulatory Visit

## 2024-12-05 ENCOUNTER — Ambulatory Visit

## 2025-01-05 ENCOUNTER — Ambulatory Visit

## 2025-02-05 ENCOUNTER — Ambulatory Visit

## 2025-03-08 ENCOUNTER — Ambulatory Visit

## 2025-04-08 ENCOUNTER — Ambulatory Visit

## 2025-05-09 ENCOUNTER — Ambulatory Visit

## 2025-06-09 ENCOUNTER — Ambulatory Visit
